# Patient Record
Sex: Male | Born: 1990 | Race: White | Hispanic: No | Marital: Single | Smoking: Light tobacco smoker
Health system: Southern US, Community
[De-identification: ages and names within clinical notes are randomized; demographics above are authoritative.]

## PROBLEM LIST (undated history)

## (undated) DIAGNOSIS — I10 Essential (primary) hypertension: Secondary | ICD-10-CM

## (undated) HISTORY — DX: Essential (primary) hypertension: I10

---

## 2016-10-19 ENCOUNTER — Encounter (HOSPITAL_COMMUNITY): Payer: Self-pay | Admitting: *Deleted

## 2016-10-19 ENCOUNTER — Emergency Department (HOSPITAL_COMMUNITY)
Admission: EM | Admit: 2016-10-19 | Discharge: 2016-10-19 | Disposition: A | Discharge status: Home / Self Care | Payer: No Typology Code available for payment source | Attending: Emergency Medicine | Admitting: Emergency Medicine

## 2016-10-19 ENCOUNTER — Emergency Department (HOSPITAL_COMMUNITY): Payer: No Typology Code available for payment source

## 2016-10-19 DIAGNOSIS — H7292 Unspecified perforation of tympanic membrane, left ear: Secondary | ICD-10-CM | POA: Diagnosis not present

## 2016-10-19 DIAGNOSIS — Y929 Unspecified place or not applicable: Secondary | ICD-10-CM | POA: Diagnosis not present

## 2016-10-19 DIAGNOSIS — F17210 Nicotine dependence, cigarettes, uncomplicated: Secondary | ICD-10-CM | POA: Diagnosis not present

## 2016-10-19 DIAGNOSIS — S0990XA Unspecified injury of head, initial encounter: Secondary | ICD-10-CM

## 2016-10-19 DIAGNOSIS — Y9389 Activity, other specified: Secondary | ICD-10-CM | POA: Insufficient documentation

## 2016-10-19 DIAGNOSIS — Y999 Unspecified external cause status: Secondary | ICD-10-CM | POA: Diagnosis not present

## 2016-10-19 MED ORDER — OFLOXACIN 0.3 % OP SOLN
5.0000 [drp] | Freq: Every day | OPHTHALMIC | Status: DC
  Administered 2016-10-19: 5 [drp] via OTIC
  Filled 2016-10-19: qty 5

## 2016-10-19 MED ORDER — ACETAMINOPHEN 325 MG PO TABS
650.0000 mg | ORAL_TABLET | Freq: Once | ORAL | Status: AC
Start: 2016-10-19 — End: 2016-10-19
  Administered 2016-10-19: 650 mg via ORAL
  Filled 2016-10-19: qty 2
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
## 2020-05-26 ENCOUNTER — Ambulatory Visit (INDEPENDENT_AMBULATORY_CARE_PROVIDER_SITE_OTHER): Payer: 59 | Admitting: Family Medicine

## 2020-05-26 ENCOUNTER — Other Ambulatory Visit: Payer: Self-pay

## 2020-05-26 ENCOUNTER — Encounter: Payer: Self-pay | Admitting: Family Medicine

## 2020-05-26 VITALS — BP 168/94 | HR 70 | Temp 98.9°F | Ht 78.0 in | Wt 354.4 lb

## 2020-05-26 DIAGNOSIS — Z202 Contact with and (suspected) exposure to infections with a predominantly sexual mode of transmission: Secondary | ICD-10-CM | POA: Diagnosis not present

## 2020-05-26 DIAGNOSIS — R400 Somnolence: Secondary | ICD-10-CM | POA: Diagnosis not present

## 2020-05-26 DIAGNOSIS — I10 Essential (primary) hypertension: Secondary | ICD-10-CM

## 2020-05-26 DIAGNOSIS — G479 Sleep disorder, unspecified: Secondary | ICD-10-CM

## 2020-05-26 DIAGNOSIS — Z114 Encounter for screening for human immunodeficiency virus [HIV]: Secondary | ICD-10-CM

## 2020-05-26 LAB — CBC
MCV: 91 fL (ref 80.0–100.0)
RBC: 5.36 10*6/uL (ref 4.20–5.80)
RDW: 12.6 % (ref 11.0–15.0)

## 2020-05-26 MED ORDER — VALSARTAN 80 MG PO TABS: 80.0000 mg | ORAL_TABLET | Freq: Every day | ORAL | 1 refills | Status: DC

## 2020-05-26 NOTE — Progress Notes (Signed)
Dakota Wolf - 30 y.o. male MRN [id number]  Date of birth: 10-Dec-1990  Subjective Chief Complaint  Patient presents with  . Hypertension    HPI Dakota Wolf is a 30 y.o. male here today for initial visit. He has lived in the area for about 4 years, decided it was time to establish with a new PCP.  He is a former Clinical cytogeneticist, now working as a Music therapist.  He has had some issues with elevated blood pressure in the past but has never been started on medication for this.  He denies symptoms related to HTN including chest pain, shortness of breath, palpitations, headache or vision changes.    He also admits to heavy snoring, daytime fatigue and frequent night time awakenings.  He has never had a sleep study before.    He would like to have STD screening today.   ROS:  A comprehensive ROS was completed and negative except as noted per HPI  No Known Allergies  History reviewed. No pertinent past medical history.  History reviewed. No pertinent surgical history.  Social History   Socioeconomic History  . Marital status: Single    Spouse name: Not on file  . Number of children: Not on file  . Years of education: Not on file  . Highest education level: Not on file  Occupational History  . Not on file  Tobacco Use  . Smoking status: Former Games developer  . Smokeless tobacco: Never Used  Vaping Use  . Vaping Use: Some days  . Substances: THC  Substance and Sexual Activity  . Alcohol use: Yes    Alcohol/week: 2.0 - 3.0 standard drinks    Types: 2 - 3 Cans of beer per week  . Drug use: No  . Sexual activity: Yes    Partners: Female    Birth control/protection: Condom  Other Topics Concern  . Not on file  Social History Narrative  . Not on file   Social Determinants of Health   Financial Resource Strain: Not on file  Food Insecurity: Not on file  Transportation Needs: Not on file  Physical Activity: Not on file  Stress: Not on file  Social Connections: Not on file     Family History  Problem Relation Age of Onset  . Hypertension Father   . Other Maternal Uncle   . Hypertension Maternal Grandmother   . Other Cousin     Health Maintenance  Topic Date Due  . Hepatitis C Screening  Never done  . COVID-19 Vaccine (1) Never done  . INFLUENZA VACCINE  Never done  . TETANUS/TDAP  11/27/2028  . HIV Screening  Completed  . HPV VACCINES  Aged Out     ----------------------------------------------------------------------------------------------------------------------------------------------------------------------------------------------------------------- Physical Exam BP (!) 168/94 (BP Location: Left Arm, Patient Position: Sitting, Cuff Size: Large)   Pulse 70   Temp 98.9 F (37.2 C) (Oral)   Ht 6\' 6"  (1.981 m)   Wt (!) 354 lb 6.4 oz (160.8 kg)   SpO2 97%   BMI 40.96 kg/m   Physical Exam Constitutional:      Appearance: Normal appearance.  HENT:     Head: Normocephalic and atraumatic.  Cardiovascular:     Rate and Rhythm: Normal rate and regular rhythm.  Pulmonary:     Effort: Pulmonary effort is normal.     Breath sounds: Normal breath sounds.  Musculoskeletal:     Cervical back: Neck supple.  Neurological:     General: No focal deficit present.  Mental Status: He is alert.  Psychiatric:        Mood and Affect: Mood normal.        Behavior: Behavior normal.     ------------------------------------------------------------------------------------------------------------------------------------------------------------------------------------------------------------------- Assessment and Plan  Essential hypertension BP elevated today and has been in the past he reports.  DASH diet and lifestyle change discussed.  Start valsartan 80mg  daily.  Follow up in about 6 weeks.    Daytime somnolence Concern for OSA, referral placed to sleep specialist.   Possible exposure to STD GC/Chlamydia, HIV and RPR ordered.    Meds  ordered this encounter  Medications  . valsartan (DIOVAN) 80 MG tablet    Sig: Take 1 tablet (80 mg total) by mouth daily.    Dispense:  90 tablet    Refill:  1    Return in about 6 weeks (around 07/07/2020) for HTN.    This visit occurred during the SARS-CoV-2 public health emergency.  Safety protocols were in place, including screening questions prior to the visit, additional usage of staff PPE, and extensive cleaning of exam room while observing appropriate contact time as indicated for disinfecting solutions.

## 2020-05-26 NOTE — Assessment & Plan Note (Signed)
Concern for OSA, referral placed to sleep specialist.

## 2020-05-26 NOTE — Assessment & Plan Note (Signed)
GC/Chlamydia, HIV and RPR ordered.

## 2020-05-26 NOTE — Patient Instructions (Signed)
Great to meet you today! Start valsartan for blood pressure Have labs completed.  You'll be contacted to set up sleep study.  See me again in about 6 weeks.    [email or web address]">  DASH Eating Plan DASH stands for Dietary Approaches to Stop Hypertension. The DASH eating plan is a healthy eating plan that has been shown to:  Reduce high blood pressure (hypertension).  Reduce your risk for type 2 diabetes, heart disease, and stroke.  Help with weight loss. What are tips for following this plan? Reading food labels  Check food labels for the amount of salt (sodium) per serving. Choose foods with less than 5 percent of the Daily Value of sodium. Generally, foods with less than 300 milligrams (mg) of sodium per serving fit into this eating plan.  To find whole grains, look for the word "whole" as the first word in the ingredient list. Shopping  Buy products labeled as "low-sodium" or "no salt added."  Buy fresh foods. Avoid canned foods and pre-made or frozen meals. Cooking  Avoid adding salt when cooking. Use salt-free seasonings or herbs instead of table salt or sea salt. Check with your health care provider or pharmacist before using salt substitutes.  Do not fry foods. Cook foods using healthy methods such as baking, boiling, grilling, roasting, and broiling instead.  Cook with heart-healthy oils, such as olive, canola, avocado, soybean, or sunflower oil. Meal planning  Eat a balanced diet that includes: ? 4 or more servings of fruits and 4 or more servings of vegetables each day. Try to fill one-half of your plate with fruits and vegetables. ? 6-8 servings of whole grains each day. ? Less than 6 oz (170 g) of lean meat, poultry, or fish each day. A 3-oz (85-g) serving of meat is about the same size as a deck of cards. One egg equals 1 oz (28 g). ? 2-3 servings of low-fat dairy each day. One serving is 1 cup (237 mL). ? 1 serving  of nuts, seeds, or beans 5 times each week. ? 2-3 servings of heart-healthy fats. Healthy fats called omega-3 fatty acids are found in foods such as walnuts, flaxseeds, fortified milks, and eggs. These fats are also found in cold-water fish, such as sardines, salmon, and mackerel.  Limit how much you eat of: ? Canned or prepackaged foods. ? Food that is high in trans fat, such as some fried foods. ? Food that is high in saturated fat, such as fatty meat. ? Desserts and other sweets, sugary drinks, and other foods with added sugar. ? Full-fat dairy products.  Do not salt foods before eating.  Do not eat more than 4 egg yolks a week.  Try to eat at least 2 vegetarian meals a week.  Eat more home-cooked food and less restaurant, buffet, and fast food.   Lifestyle  When eating at a restaurant, ask that your food be prepared with less salt or no salt, if possible.  If you drink alcohol: ? Limit how much you use to:  0-1 drink a day for women who are not pregnant.  0-2 drinks a day for men. ? Be aware of how much alcohol is in your drink. In the [place] one drink equals one 12 oz bottle of beer (355 mL), one 5 oz glass of wine (148 mL), or one 1 oz glass of hard liquor (44 mL). General information  Avoid eating more than 2,300 mg of salt a day. If you have hypertension, you may need to  reduce your sodium intake to 1,500 mg a day.  Work with your health care provider to maintain a healthy body weight or to lose weight. Ask what an ideal weight is for you.  Get at least 30 minutes of exercise that causes your heart to beat faster (aerobic exercise) most days of the week. Activities may include walking, swimming, or biking.  Work with your health care provider or dietitian to adjust your eating plan to your individual calorie needs. What foods should I eat? Fruits All fresh, dried, or frozen fruit. Canned fruit in natural juice (without added sugar). Vegetables Fresh or frozen  vegetables (raw, steamed, roasted, or grilled). Low-sodium or reduced-sodium tomato and vegetable juice. Low-sodium or reduced-sodium tomato sauce and tomato paste. Low-sodium or reduced-sodium canned vegetables. Grains Whole-grain or whole-wheat bread. Whole-grain or whole-wheat pasta. Brown rice. Orpah Cobb. Bulgur. Whole-grain and low-sodium cereals. Pita bread. Low-fat, low-sodium crackers. Whole-wheat flour tortillas. Meats and other proteins Skinless chicken or [place]. Ground chicken or [place]. Pork with fat trimmed off. Fish and seafood. Egg whites. Dried beans, peas, or lentils. Unsalted nuts, nut butters, and seeds. Unsalted canned beans. Lean cuts of beef with fat trimmed off. Low-sodium, lean precooked or cured meat, such as sausages or meat loaves. Dairy Low-fat (1%) or fat-free (skim) milk. Reduced-fat, low-fat, or fat-free cheeses. Nonfat, low-sodium ricotta or cottage cheese. Low-fat or nonfat yogurt. Low-fat, low-sodium cheese. Fats and oils Soft margarine without trans fats. Vegetable oil. Reduced-fat, low-fat, or light mayonnaise and salad dressings (reduced-sodium). Canola, safflower, olive, avocado, soybean, and sunflower oils. Avocado. Seasonings and condiments Herbs. Spices. Seasoning mixes without salt. Other foods Unsalted popcorn and pretzels. Fat-free sweets. The items listed above may not be a complete list of foods and beverages you can eat. Contact a dietitian for more information. What foods should I avoid? Fruits Canned fruit in a light or heavy syrup. Fried fruit. Fruit in cream or butter sauce. Vegetables Creamed or fried vegetables. Vegetables in a cheese sauce. Regular canned vegetables (not low-sodium or reduced-sodium). Regular canned tomato sauce and paste (not low-sodium or reduced-sodium). Regular tomato and vegetable juice (not low-sodium or reduced-sodium). Rosita Fire. Olives. Grains Baked goods made with fat, such as croissants, muffins, or some  breads. Dry pasta or rice meal packs. Meats and other proteins Fatty cuts of meat. Ribs. Fried meat. Tomasa Blase. Bologna, salami, and other precooked or cured meats, such as sausages or meat loaves. Fat from the back of a pig (fatback). Bratwurst. Salted nuts and seeds. Canned beans with added salt. Canned or smoked fish. Whole eggs or egg yolks. Chicken or [place] with skin. Dairy Whole or 2% milk, cream, and half-and-half. Whole or full-fat cream cheese. Whole-fat or sweetened yogurt. Full-fat cheese. Nondairy creamers. Whipped toppings. Processed cheese and cheese spreads. Fats and oils Butter. Stick margarine. Lard. Shortening. Ghee. Bacon fat. Tropical oils, such as coconut, palm kernel, or palm oil. Seasonings and condiments Onion salt, garlic salt, seasoned salt, table salt, and sea salt. Worcestershire sauce. Tartar sauce. Barbecue sauce. Teriyaki sauce. Soy sauce, including reduced-sodium. Steak sauce. Canned and packaged gravies. Fish sauce. Oyster sauce. Cocktail sauce. Store-bought horseradish. Ketchup. Mustard. Meat flavorings and tenderizers. Bouillon cubes. Hot sauces. Pre-made or packaged marinades. Pre-made or packaged taco seasonings. Relishes. Regular salad dressings. Other foods Salted popcorn and pretzels. The items listed above may not be a complete list of foods and beverages you should avoid. Contact a dietitian for more information. Where to find more information  National Heart, Lung, and Blood Institute: PopSteam.is  American Heart Association: www.heart.org  Academy of Nutrition and Dietetics: www.eatright.Revloc: www.kidney.org Summary  The DASH eating plan is a healthy eating plan that has been shown to reduce high blood pressure (hypertension). It may also reduce your risk for type 2 diabetes, heart disease, and stroke.  When on the DASH eating plan, aim to eat more fresh fruits and vegetables, whole grains, lean proteins, low-fat  dairy, and heart-healthy fats.  With the DASH eating plan, you should limit salt (sodium) intake to 2,300 mg a day. If you have hypertension, you may need to reduce your sodium intake to 1,500 mg a day.  Work with your health care provider or dietitian to adjust your eating plan to your individual calorie needs. This information is not intended to replace advice given to you by your health care provider. Make sure you discuss any questions you have with your health care provider. Document Revised: 02/15/2019 Document Reviewed: 02/15/2019 Elsevier Patient Education  2021 [employer].

## 2020-05-26 NOTE — Assessment & Plan Note (Signed)
BP elevated today and has been in the past he reports.  DASH diet and lifestyle change discussed.  Start valsartan 80mg  daily.  Follow up in about 6 weeks.

## 2020-05-27 LAB — COMPLETE METABOLIC PANEL WITH GFR
AG Ratio: 1.6 (calc) (ref 1.0–2.5)
ALT: 89 U/L — ABNORMAL HIGH (ref 9–46)
AST: 38 U/L (ref 10–40)
Albumin: 4.5 g/dL (ref 3.6–5.1)
Alkaline phosphatase (APISO): 68 U/L (ref 36–130)
BUN: 16 mg/dL (ref 7–25)
CO2: 29 mmol/L (ref 20–32)
Calcium: 9.6 mg/dL (ref 8.6–10.3)
Chloride: 102 mmol/L (ref 98–110)
Creat: 0.9 mg/dL (ref 0.60–1.35)
GFR, Est African American: 132 mL/min/{1.73_m2} (ref >=60)
GFR, Est Non African American: 114 mL/min/{1.73_m2} (ref >=60)
Globulin: 2.9 g/dL (calc) (ref 1.9–3.7)
Glucose, Bld: 88 mg/dL (ref 65–99)
Potassium: 4.2 mmol/L (ref 3.5–5.3)
Sodium: 140 mmol/L (ref 135–146)
Total Bilirubin: 1.1 mg/dL (ref 0.2–1.2)
Total Protein: 7.4 g/dL (ref 6.1–8.1)

## 2020-05-27 LAB — CBC
HCT: 48.8 % (ref 38.5–50.0)
Hemoglobin: 16.4 g/dL (ref 13.2–17.1)
MCH: 30.6 pg (ref 27.0–33.0)
MCHC: 33.6 g/dL (ref 32.0–36.0)
MPV: 10.7 fL (ref 7.5–12.5)
Platelets: 215 10*3/uL (ref 140–400)
WBC: 6.6 10*3/uL (ref 3.8–10.8)

## 2020-05-27 LAB — TSH: TSH: 2.56 mIU/L (ref 0.40–4.50)

## 2020-05-27 LAB — HIV ANTIBODY (ROUTINE TESTING W REFLEX): HIV 1&2 Ab, 4th Generation: NONREACTIVE

## 2020-07-02 ENCOUNTER — Ambulatory Visit: Payer: 59 | Admitting: Neurology

## 2020-07-02 ENCOUNTER — Encounter: Payer: Self-pay | Admitting: Neurology

## 2020-07-02 VITALS — BP 151/97 | HR 74 | Ht 78.0 in | Wt 356.5 lb

## 2020-07-02 DIAGNOSIS — G4719 Other hypersomnia: Secondary | ICD-10-CM

## 2020-07-02 DIAGNOSIS — R030 Elevated blood-pressure reading, without diagnosis of hypertension: Secondary | ICD-10-CM | POA: Diagnosis not present

## 2020-07-02 DIAGNOSIS — R0683 Snoring: Secondary | ICD-10-CM | POA: Diagnosis not present

## 2020-07-02 DIAGNOSIS — J342 Deviated nasal septum: Secondary | ICD-10-CM

## 2020-07-02 DIAGNOSIS — R635 Abnormal weight gain: Secondary | ICD-10-CM

## 2020-07-02 DIAGNOSIS — Z9189 Other specified personal risk factors, not elsewhere classified: Secondary | ICD-10-CM

## 2020-07-02 DIAGNOSIS — R0689 Other abnormalities of breathing: Secondary | ICD-10-CM

## 2020-07-02 DIAGNOSIS — R0681 Apnea, not elsewhere classified: Secondary | ICD-10-CM

## 2020-07-02 DIAGNOSIS — J351 Hypertrophy of tonsils: Secondary | ICD-10-CM

## 2020-07-02 NOTE — Progress Notes (Signed)
Subjective:    Patient ID: Dakota Wolf is a 30 y.o. male.  HPI     Huston Foley, MD, PhD [facility] [address] [address] [place], [place] [address]  Dear Dr. Ashley Royalty,   I saw your patient, Dakota Wolf, upon your kind request, in my Sleep clinic today for initial consultation of his sleep disorder, in particular, concern for underlying obstructive sleep apnea.  The patient is unaccompanied today.  As you know, [name] is a 30 year old right-handed gentleman with an underlying medical history of hypertension, and severe obesity with a BMI of over 40, who reports snoring and excessive daytime somnolence.  I reviewed your office note from 05/26/2020.  His Epworth sleepiness score is 8/24, fatigue severity score is 40 out of 63.  He does not wake up rested.  He does not remember the last time he slept through the night.  He has been noted to have pauses in his breathing and has woken up with a sense of gasping for air.  He is not aware of any family history of sleep apnea.  He saw an ENT in [facility] for an ear infection was noted to have large tonsils and suspected to have sleep apnea.  He did not take his blood pressure medicine today but does take it consistently.  He has lower extremity edema occasionally.  He reports a bedtime of 10 and rise time between 730 and 8.  He lives with his mother currently.  He is single, no children, has 2 dogs and 1 cat in the household.  He is a noncigarette smoker but smokes a cigar occasionally.  He drinks caffeine in the form of coffee, about 20 ounce cup per day and 1 or 2 sodas per day, is trying to cut back.  He has gained weight in the past year and a half in the realm of 30 pounds.  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  He played football and has had nasal injuries, was told he had a deviated septum as well.  It was never corrected.  He denies night to night  nocturia or recurrent morning headaches.  He will be traveling to [facility] for a try out for a movie next week and may stay for 1 month if he gets the role.  He has never had a sleep study, would prefer to come in for a laboratory attended sleep study if possible.  He drinks alcohol occasionally, up to 3 drinks a week.  His Past Medical History Is Significant For: Past Medical History:  Diagnosis Date  . Hypertension     His Past Surgical History Is Significant For: No past surgical history on file.  His Family History Is Significant For: Family History  Problem Relation Age of Onset  . Hypertension Father   . Other Maternal Uncle   . Hypertension Maternal Grandmother   . Other Cousin     His Social History Is Significant For: Social History   Socioeconomic History  . Marital status: Single    Spouse name: Not on file  . Number of children: 0  . Years of education: Not on file  . Highest education level: Bachelor's degree (e.g., BA, AB, BS)  Occupational History  . Not on file  Tobacco Use  . Smoking status: Light Tobacco Smoker    Types: Cigars  . Smokeless tobacco: Never Used  . Tobacco comment: 07/02/20 occas cigar  Vaping Use  . Vaping Use: Some days  . Substances: THC  Substance and Sexual Activity  . Alcohol use: Yes    Alcohol/week: 2.0 - 3.0 standard drinks    Types: 2 - 3 Cans of beer per week  . Drug use: No  . Sexual activity: Yes    Partners: Female    Birth control/protection: Condom  Other Topics Concern  . Not on file  Social History Narrative   Caffeine- 20 oz coffee, 2 sodas daily   Social Determinants of Health   Financial Resource Strain: Not on file  Food Insecurity: Not on file  Transportation Needs: Not on file  Physical Activity: Not on file  Stress: Not on file  Social Connections: Not on file    His Allergies Are:  No Known Allergies:   His Current Medications Are:  Outpatient Encounter Medications as of 07/02/2020  Medication Sig   . valsartan (DIOVAN) 80 MG tablet Take 1 tablet (80 mg total) by mouth daily.   No facility-administered encounter medications on file as of 07/02/2020.  :   Review of Systems:  Out of a complete 14 point review of systems, all are reviewed and negative with the exception of these symptoms as listed below:  Review of Systems  Neurological:       New patient, paper referral here alone for daytime sleepiness, sleep disturbance, wake frequently during the night. No prior sleep study.  Epworth Sleepiness Scale 0= would never doze 1= slight chance of dozing 2= moderate chance of dozing 3= high chance of dozing  Sitting and reading:1 Watching TV:2 Sitting inactive in a public place (ex. Theater or meeting):1 As a passenger in a car for an hour without a break:2 Lying down to rest in the afternoon:2 Sitting and talking to someone:0 Sitting quietly after lunch (no alcohol):0 In a car, while stopped in traffic:0 Total:8    Objective:  Neurological Exam  Physical Exam Physical Examination:   Vitals:   07/02/20 0853  BP: (!) 151/97  Pulse: 74    General Examination: The patient is a very pleasant 30 y.o. male in no acute distress. He appears well-developed and well-nourished and well groomed.   HEENT: Normocephalic, atraumatic, pupils are equal, round and reactive to light, extraocular tracking is good without limitation to gaze excursion or nystagmus noted. Hearing is grossly intact. Face is symmetric with normal facial animation. Speech is clear with no dysarthria noted. There is no hypophonia. There is no lip, neck/head, jaw or voice tremor. Neck is supple with full range of passive and active motion. There are no carotid bruits on auscultation. Oropharynx exam reveals: mild mouth dryness, adequate dental hygiene and marked airway crowding, due to severe tonsillar hypertrophy, right side 4+, left side 3+.  Mallampati class II.  Neck circumference of 19-1/2 inches.  Tongue  protrudes centrally and palate elevates symmetrically.  Nasal inspection reveals mildly deviated septum, also mild nasal deformity from the outside.  Chest: Clear to auscultation without wheezing, rhonchi or crackles noted.  Heart: S1+S2+0, regular and normal without murmurs, rubs or gallops noted.   Abdomen: Soft, non-tender and non-distended with normal bowel sounds appreciated on auscultation.  Extremities: There is no pitting edema in the distal lower extremities bilaterally.   Skin: Warm and dry without trophic changes noted.   Musculoskeletal: exam reveals no obvious joint deformities, tenderness or joint swelling or erythema.   Neurologically:  Mental status: The patient is awake, alert and oriented in all 4 spheres. His immediate  and remote memory, attention, language skills and fund of knowledge are appropriate. There is no evidence of aphasia, agnosia, apraxia or anomia. Speech is clear with normal prosody and enunciation. Thought process is linear. Mood is normal and affect is normal.  Cranial nerves II - XII are as described above under HEENT exam.  Motor exam: Normal bulk, strength and tone is noted. There is no tremor. Fine motor skills and coordination: grossly intact.  Cerebellar testing: No dysmetria or intention tremor. There is no truncal or gait ataxia.  Sensory exam: intact to light touch in the upper and lower extremities.  Gait, station and balance: He stands easily. No veering to one side is noted. No leaning to one side is noted. Posture is age-appropriate and stance is narrow based. Gait shows normal stride length and normal pace. No problems turning are noted.   Assessment and Plan:  In summary, Dakota Mandigo is a very pleasant 30 y.o.-year old male with an underlying medical history of hypertension, and severe obesity with a BMI of over 40, whose history and physical exam are concerning for obstructive sleep apnea (OSA). I had a long chat with the patient about my  findings and the diagnosis of OSA, its prognosis and treatment options. We talked about medical treatments, surgical interventions and non-pharmacological approaches. I explained in particular the risks and ramifications of untreated moderate to severe OSA, especially with respect to developing cardiovascular disease down the Road, including congestive heart failure, difficult to treat hypertension, cardiac arrhythmias, or stroke. Even type 2 diabetes has, in part, been linked to untreated OSA. Symptoms of untreated OSA include daytime sleepiness, memory problems, mood irritability and mood disorder such as depression and anxiety, lack of energy, as well as recurrent headaches, especially morning headaches. We talked about smoking cessation and trying to maintain a healthy lifestyle in general, as well as the importance of weight control. We also talked about the importance of good sleep hygiene. I recommended the following at this time: sleep study.   I explained the sleep test procedure to the patient and also outlined possible surgical and non-surgical treatment options of OSA, including the use of a custom-made dental device (which would require a referral to a specialist dentist or oral surgeon), upper airway surgical options, such as traditional UPPP or a novel less invasive surgical option in the form of Inspire hypoglossal nerve stimulation (which would involve a referral to an ENT surgeon). I also explained the CPAP treatment option to the patient, who indicated that he would be willing to try CPAP if the need arises. I explained the importance of being compliant with PAP treatment, not only for insurance purposes but primarily to improve His symptoms, and for the patient's long term health benefit, including to reduce His cardiovascular risks. I answered all his questions today and the patient was in agreement. I plan to see him back after the sleep study is completed and encouraged him to call with  any interim questions, concerns, problems or updates.   Thank you very much for allowing me to participate in the care of this nice patient. If I can be of any further assistance to you please do not hesitate to call me at [phone].  Sincerely,   Huston Foley, MD, PhD

## 2020-07-02 NOTE — Patient Instructions (Signed)

## 2020-07-07 ENCOUNTER — Other Ambulatory Visit: Payer: Self-pay

## 2020-07-07 ENCOUNTER — Encounter: Payer: Self-pay | Admitting: Family Medicine

## 2020-07-07 ENCOUNTER — Ambulatory Visit (INDEPENDENT_AMBULATORY_CARE_PROVIDER_SITE_OTHER): Payer: 59 | Admitting: Family Medicine

## 2020-07-07 VITALS — BP 160/95 | HR 72 | Temp 97.7°F | Wt 363.0 lb

## 2020-07-07 DIAGNOSIS — I10 Essential (primary) hypertension: Secondary | ICD-10-CM

## 2020-07-07 DIAGNOSIS — R400 Somnolence: Secondary | ICD-10-CM

## 2020-07-07 MED ORDER — VALSARTAN 160 MG PO TABS: 160.0000 mg | ORAL_TABLET | Freq: Every day | ORAL | 1 refills | Status: DC

## 2020-07-07 NOTE — Assessment & Plan Note (Signed)
Has upcoming sleep study. 

## 2020-07-07 NOTE — Assessment & Plan Note (Signed)
Blood pressure is not at goal at for age and co-morbidities.  I recommend increasing valsartan to 160mg .  In addition they were instructed to follow a low sodium diet with regular exercise to help to maintain adequate control of blood pressure.

## 2020-07-07 NOTE — Patient Instructions (Signed)
Increase valsartan to 160mg  daily.  See me again in about 6 weeks for follow up.  Have labs completed today.

## 2020-07-07 NOTE — Progress Notes (Signed)
Dakota Wolf - 30 y.o. male MRN [id number]  Date of birth: 21-Jun-1990  Subjective Chief Complaint  Patient presents with  . Hypertension    HPI Dakota Wolf is a 30 y.o. male here today for follow up of HTN.  Started on valsartan 80mg  at last visit.  He is tolerating this well.  BP remains elevated today.  He has tried to be more conscious about salt intake.  Weight his up about 10lbs since his last visit.  He did have evaluation for sleep study and plans to have this completed depending on insurance coverage.  He denies chest pain, shortness of breath, palpitations, headache or vision changes.   ROS:  A comprehensive ROS was completed and negative except as noted per HPI  No Known Allergies  Past Medical History:  Diagnosis Date  . Hypertension     History reviewed. No pertinent surgical history.  Social History   Socioeconomic History  . Marital status: Single    Spouse name: Not on file  . Number of children: 0  . Years of education: Not on file  . Highest education level: Bachelor's degree (e.g., BA, AB, BS)  Occupational History  . Not on file  Tobacco Use  . Smoking status: Light Tobacco Smoker    Types: Cigars  . Smokeless tobacco: Never Used  . Tobacco comment: 07/02/20 occas cigar  Vaping Use  . Vaping Use: Some days  . Substances: THC  Substance and Sexual Activity  . Alcohol use: Yes    Alcohol/week: 2.0 - 3.0 standard drinks    Types: 2 - 3 Cans of beer per week  . Drug use: No  . Sexual activity: Yes    Partners: Female    Birth control/protection: Condom  Other Topics Concern  . Not on file  Social History Narrative   Caffeine- 20 oz coffee, 2 sodas daily   Social Determinants of Health   Financial Resource Strain: Not on file  Food Insecurity: Not on file  Transportation Needs: Not on file  Physical Activity: Not on file  Stress: Not on file  Social Connections: Not on file    Family History  Problem Relation Age of Onset  . Hypertension  Father   . Other Maternal Uncle   . Hypertension Maternal Grandmother   . Other Cousin     Health Maintenance  Topic Date Due  . Hepatitis C Screening  Never done  . COVID-19 Vaccine (1) Never done  . INFLUENZA VACCINE  10/26/2020  . TETANUS/TDAP  11/27/2028  . HIV Screening  Completed  . HPV VACCINES  Aged Out     ----------------------------------------------------------------------------------------------------------------------------------------------------------------------------------------------------------------- Physical Exam BP (!) 160/95 (BP Location: Left Arm, Patient Position: Sitting, Cuff Size: Large)   Pulse 72   Temp 97.7 F (36.5 C)   Wt (!) 363 lb (164.7 kg)   SpO2 100%   BMI 41.95 kg/m   Physical Exam Constitutional:      Appearance: Normal appearance.  HENT:     Head: Normocephalic and atraumatic.  Eyes:     General: No scleral icterus. Cardiovascular:     Rate and Rhythm: Normal rate and regular rhythm.  Pulmonary:     Effort: Pulmonary effort is normal.     Breath sounds: Normal breath sounds.  Musculoskeletal:     Cervical back: Neck supple.  Neurological:     General: No focal deficit present.     Mental Status: He is alert.  Psychiatric:        Mood  and Affect: Mood normal.        Behavior: Behavior normal.     ------------------------------------------------------------------------------------------------------------------------------------------------------------------------------------------------------------------- Assessment and Plan  Essential hypertension Blood pressure is not at goal at for age and co-morbidities.  I recommend increasing valsartan to 160mg .  In addition they were instructed to follow a low sodium diet with regular exercise to help to maintain adequate control of blood pressure.    Daytime somnolence Has upcoming sleep study.     Meds ordered this encounter  Medications  . valsartan (DIOVAN) 160 MG  tablet    Sig: Take 1 tablet (160 mg total) by mouth daily.    Dispense:  90 tablet    Refill:  1    Return in about 6 weeks (around 08/18/2020) for HTN.    This visit occurred during the SARS-CoV-2 public health emergency.  Safety protocols were in place, including screening questions prior to the visit, additional usage of staff PPE, and extensive cleaning of exam room while observing appropriate contact time as indicated for disinfecting solutions.

## 2020-07-08 LAB — BASIC METABOLIC PANEL
BUN: 16 mg/dL (ref 7–25)
CO2: 25 mmol/L (ref 20–32)
Calcium: 9.4 mg/dL (ref 8.6–10.3)
Chloride: 104 mmol/L (ref 98–110)
Creat: 0.98 mg/dL (ref 0.60–1.35)
Glucose, Bld: 90 mg/dL (ref 65–99)
Potassium: 4.7 mmol/L (ref 3.5–5.3)
Sodium: 141 mmol/L (ref 135–146)

## 2020-08-03 ENCOUNTER — Telehealth: Payer: Self-pay

## 2020-08-03 NOTE — Telephone Encounter (Signed)
LVM for pt to call me back to schedule sleep study  

## 2020-08-17 ENCOUNTER — Telehealth: Payer: Self-pay

## 2020-08-17 NOTE — Telephone Encounter (Signed)
LVM for pt to call me back to schedule sleep study  

## 2020-08-18 ENCOUNTER — Ambulatory Visit: Payer: 59 | Admitting: Family Medicine

## 2020-08-25 ENCOUNTER — Ambulatory Visit (INDEPENDENT_AMBULATORY_CARE_PROVIDER_SITE_OTHER): Payer: 59 | Admitting: Family Medicine

## 2020-08-25 ENCOUNTER — Encounter: Payer: Self-pay | Admitting: Family Medicine

## 2020-08-25 ENCOUNTER — Other Ambulatory Visit: Payer: Self-pay

## 2020-08-25 ENCOUNTER — Telehealth: Payer: Self-pay

## 2020-08-25 DIAGNOSIS — I10 Essential (primary) hypertension: Secondary | ICD-10-CM | POA: Diagnosis not present

## 2020-08-25 MED ORDER — VALSARTAN 160 MG PO TABS: 160.0000 mg | ORAL_TABLET | Freq: Every day | ORAL | 2 refills | Status: DC

## 2020-08-25 NOTE — Progress Notes (Signed)
Dakota Wolf - 30 y.o. male MRN [id number]  Date of birth: 1990/07/09  Subjective No chief complaint on file.   HPI Dakota Wolf is a 30 y.o. male here today for follow up of HTN.  Valsartan increased to 160mg  at last appointment.  He is tolerating this well with better control of BP at today's visit.  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  He has been contacted for sleep study appt but hasn't returned call to set up appointment.  He denies chest pain, shortness of breath, palpitations, headache or vision changes.   ROS:  A comprehensive ROS was completed and negative except as noted per HPI  No Known Allergies  Past Medical History:  Diagnosis Date  . Hypertension     No past surgical history on file.  Social History   Socioeconomic History  . Marital status: Single    Spouse name: Not on file  . Number of children: 0  . Years of education: Not on file  . Highest education level: Bachelor's degree (e.g., BA, AB, BS)  Occupational History  . Not on file  Tobacco Use  . Smoking status: Light Tobacco Smoker    Types: Cigars  . Smokeless tobacco: Never Used  . Tobacco comment: 07/02/20 occas cigar  Vaping Use  . Vaping Use: Some days  . Substances: THC  Substance and Sexual Activity  . Alcohol use: Yes    Alcohol/week: 2.0 - 3.0 standard drinks    Types: 2 - 3 Cans of beer per week  . Drug use: No  . Sexual activity: Yes    Partners: Female    Birth control/protection: Condom  Other Topics Concern  . Not on file  Social History Narrative   Caffeine- 20 oz coffee, 2 sodas daily   Social Determinants of Health   Financial Resource Strain: Not on file  Food Insecurity: Not on file  Transportation Needs: Not on file  Physical Activity: Not on file  Stress: Not on file  Social Connections: Not on file    Family History  Problem Relation Age of Onset  . Hypertension Father   . Other Maternal Uncle    . Hypertension Maternal Grandmother   . Other Cousin     Health Maintenance  Topic Date Due  . COVID-19 Vaccine (1) Never done  . Hepatitis C Screening  Never done  . INFLUENZA VACCINE  10/26/2020  . TETANUS/TDAP  11/27/2028  . Zoster Vaccines- Shingrix (1 of 2) 12/07/2039  . HIV Screening  Completed  . HPV VACCINES  Aged Out     ----------------------------------------------------------------------------------------------------------------------------------------------------------------------------------------------------------------- Physical Exam BP 138/88   Pulse 87   Ht 6\' 6"  (1.981 m)   Wt (!) 363 lb (164.7 kg)   BMI 41.95 kg/m   Physical Exam HENT:     Head: Normocephalic and atraumatic.  Cardiovascular:     Rate and Rhythm: Normal rate and regular rhythm.  Pulmonary:     Effort: Pulmonary effort is normal.     Breath sounds: Normal breath sounds.  Neurological:     General: No focal deficit present.     Mental Status: He is alert.  Psychiatric:        Mood and Affect: Mood normal.        Behavior: Behavior normal.     ------------------------------------------------------------------------------------------------------------------------------------------------------------------------------------------------------------------- Assessment and Plan  Essential hypertension Blood pressure is at goal at for age and co-morbidities.  I recommend continuation  of valsartan at current strength.  He will call to schedule sleep study.  In addition they were instructed to follow a low sodium diet with regular exercise to help to maintain adequate control of blood pressure.     No orders of the defined types were placed in this encounter.   No follow-ups on file.    This visit occurred during the SARS-CoV-2 public health emergency.  Safety protocols were in place, including screening questions prior to the visit, additional usage of staff PPE, and extensive  cleaning of exam room while observing appropriate contact time as indicated for disinfecting solutions.

## 2020-08-25 NOTE — Telephone Encounter (Signed)
We have attempted to call the patient 2 times to schedule sleep study. Patient has been unavailable at the phone numbers we have on file and has not returned our calls. If patient calls back we will schedule them for their sleep study. ° °

## 2020-08-25 NOTE — Assessment & Plan Note (Signed)
Blood pressure is at goal at for age and co-morbidities.  I recommend continuation of valsartan at current strength.  He will call to schedule sleep study.  In addition they were instructed to follow a low sodium diet with regular exercise to help to maintain adequate control of blood pressure.

## 2020-10-21 ENCOUNTER — Encounter: Payer: Self-pay | Admitting: Neurology

## 2021-03-03 ENCOUNTER — Ambulatory Visit (INDEPENDENT_AMBULATORY_CARE_PROVIDER_SITE_OTHER): Payer: 59 | Admitting: Family Medicine

## 2021-03-03 ENCOUNTER — Other Ambulatory Visit: Payer: Self-pay

## 2021-03-03 ENCOUNTER — Encounter: Payer: Self-pay | Admitting: Family Medicine

## 2021-03-03 DIAGNOSIS — I10 Essential (primary) hypertension: Secondary | ICD-10-CM | POA: Diagnosis not present

## 2021-03-03 DIAGNOSIS — R400 Somnolence: Secondary | ICD-10-CM | POA: Diagnosis not present

## 2021-03-03 MED ORDER — VALSARTAN 160 MG PO TABS: 160.0000 mg | ORAL_TABLET | Freq: Every day | ORAL | 2 refills | Status: DC

## 2021-03-03 NOTE — Assessment & Plan Note (Signed)
Blood pressure remains mildly elevated.  He will continue to work on diet and exercise changes.  Continue Diovan at current strength.  Low-sodium diet encouraged.  Follow-up in 6 months

## 2021-03-03 NOTE — Assessment & Plan Note (Signed)
He had to reschedule his sleep study.  He will contact neurology to get this set back up again.

## 2021-03-03 NOTE — Progress Notes (Signed)
Dakota Wolf - 30 y.o. male MRN [id number]  Date of birth: 12-22-1990  Subjective Chief Complaint  Patient presents with   Hypertension    HPI Dakota is a very pleasant 30 year old male here today for follow-up visit.  Reports overall he is doing well.  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  Continues to tolerate valsartan at current strength.  Blood pressure readings have improved.  He is working on diet and exercise changes well to improve his blood pressure.  He denies any symptoms related to hypertension including chest pain, shortness of breath, palpitations, headache or vision changes.  ROS:  A comprehensive ROS was completed and negative except as noted per HPI  No Known Allergies  Past Medical History:  Diagnosis Date   Hypertension     History reviewed. No pertinent surgical history.  Social History   Socioeconomic History   Marital status: Single    Spouse name: Not on file   Number of children: 0   Years of education: Not on file   Highest education level: Bachelor's degree (e.g., BA, AB, BS)  Occupational History   Not on file  Tobacco Use   Smoking status: Light Smoker    Types: Cigars   Smokeless tobacco: Never   Tobacco comments:    07/02/20 occas cigar  Vaping Use   Vaping Use: Some days   Substances: THC  Substance and Sexual Activity   Alcohol use: Yes    Alcohol/week: 2.0 - 3.0 standard drinks    Types: 2 - 3 Cans of beer per week   Drug use: No   Sexual activity: Yes    Partners: Female    Birth control/protection: Condom  Other Topics Concern   Not on file  Social History Narrative   Caffeine- 20 oz coffee, 2 sodas daily   Social Determinants of Health   Financial Resource Strain: Not on file  Food Insecurity: Not on file  Transportation Needs: Not on file  Physical Activity: Not on file  Stress: Not on file  Social Connections: Not on file    Family History  Problem Relation Age of Onset    Hypertension Father    Other Maternal Uncle    Hypertension Maternal Grandmother    Other Cousin     Health Maintenance  Topic Date Due   Hepatitis C Screening  Never done   INFLUENZA VACCINE  06/25/2021 (Originally 10/26/2020)   Pneumococcal Vaccine 6-15 Years old (1 - PCV) 03/03/2022 (Originally 12/07/1995)   COVID-19 Vaccine (1) 03/19/2022 (Originally 06[birth year]06/1990)   TETANUS/TDAP  11/27/2028   HIV Screening  Completed   HPV VACCINES  Aged Out     ----------------------------------------------------------------------------------------------------------------------------------------------------------------------------------------------------------------- Physical Exam BP (!) 149/76 (BP Location: Left Arm, Patient Position: Sitting, Cuff Size: Large)   Pulse 68   Ht 6\' 6"  (1.981 m)   Wt (!) 359 lb (162.8 kg)   SpO2 97%   BMI 41.49 kg/m   Physical Exam Constitutional:      Appearance: Normal appearance.  Eyes:     General: No scleral icterus. Cardiovascular:     Rate and Rhythm: Normal rate and regular rhythm.  Pulmonary:     Effort: Pulmonary effort is normal.     Breath sounds: Normal breath sounds.  Musculoskeletal:     Cervical back: Neck supple.  Neurological:     Mental Status: He is alert.  Psychiatric:        Mood and Affect: Mood normal.  Behavior: Behavior normal.    ------------------------------------------------------------------------------------------------------------------------------------------------------------------------------------------------------------------- Assessment and Plan  Essential hypertension Blood pressure remains mildly elevated.  He will continue to work on diet and exercise changes.  Continue Diovan at current strength.  Low-sodium diet encouraged.  Follow-up in 6 months  Daytime somnolence He had to reschedule his sleep study.  He will contact neurology to get this set back up again.   Meds ordered this encounter   Medications   valsartan (DIOVAN) 160 MG tablet    Sig: Take 1 tablet (160 mg total) by mouth daily.    Dispense:  90 tablet    Refill:  2    Return in about 6 months (around 09/01/2021) for HTN.    This visit occurred during the SARS-CoV-2 public health emergency.  Safety protocols were in place, including screening questions prior to the visit, additional usage of staff PPE, and extensive cleaning of exam room while observing appropriate contact time as indicated for disinfecting solutions.

## 2021-03-03 NOTE — Patient Instructions (Signed)
[facility](Dr. [id number])-([phone]) [phone]

## 2021-05-27 ENCOUNTER — Ambulatory Visit (INDEPENDENT_AMBULATORY_CARE_PROVIDER_SITE_OTHER): Payer: 59

## 2021-05-27 ENCOUNTER — Other Ambulatory Visit: Payer: Self-pay

## 2021-05-27 ENCOUNTER — Ambulatory Visit (INDEPENDENT_AMBULATORY_CARE_PROVIDER_SITE_OTHER): Payer: 59 | Admitting: Family Medicine

## 2021-05-27 ENCOUNTER — Encounter: Payer: Self-pay | Admitting: Family Medicine

## 2021-05-27 VITALS — BP 147/91 | HR 86 | Ht 78.0 in | Wt 358.0 lb

## 2021-05-27 DIAGNOSIS — M5416 Radiculopathy, lumbar region: Secondary | ICD-10-CM | POA: Diagnosis not present

## 2021-05-27 MED ORDER — KETOROLAC TROMETHAMINE 30 MG/ML IJ SOLN
30.0000 mg | Freq: Once | INTRAMUSCULAR | Status: AC
  Administered 2021-05-27: 30 mg via INTRAMUSCULAR

## 2021-05-27 MED ORDER — CYCLOBENZAPRINE HCL 10 MG PO TABS: 10.0000 mg | ORAL_TABLET | Freq: Three times a day (TID) | ORAL | 0 refills | Status: DC | PRN

## 2021-05-27 MED ORDER — PREDNISONE 50 MG PO TABS: ORAL_TABLET | ORAL | 0 refills | Status: DC

## 2021-05-27 MED ORDER — KETOROLAC TROMETHAMINE 30 MG/ML IJ SOLN: 30.0000 mg | Freq: Once | INTRAMUSCULAR | Status: DC

## 2021-05-27 MED ORDER — TRAMADOL HCL 50 MG PO TABS: 50.0000 mg | ORAL_TABLET | Freq: Three times a day (TID) | ORAL | 0 refills | Status: AC | PRN

## 2021-05-27 NOTE — Assessment & Plan Note (Signed)
Injection of Toradol 30 mg given today.  Start prednisone 50 mg daily with Flexeril as needed.  We will also add tramadol as needed.  Discussed stretches to help with symptoms.  Updated x-rays ordered.  If symptoms not improving we will plan to get MRI for interventional planning. ?

## 2021-05-27 NOTE — Progress Notes (Signed)
?Dakota Wolf - 31 y.o. male MRN [id number]  Date of birth: Jan 07, 1991 ? ?Subjective ?Chief Complaint  ?Patient presents with  ? Back Pain  ? ? ?HPI ?Dakota is a 31 year old male here today with complaint of back pain.  He has history of herniated disc and has flares from time to time.  He has had ESI in the past.  Reports that current symptoms started yesterday after sneezing.  He does have some radiation into the left leg.  He denies any weakness.  He has tried ibuprofen without much relief.  He is interested in having MRI with potential interventional procedures if not improving. ? ?ROS:  A comprehensive ROS was completed and negative except as noted per HPI ? ?No Known Allergies ? ?Past Medical History:  ?Diagnosis Date  ? Hypertension   ? ? ?History reviewed. No pertinent surgical history. ? ?Social History  ? ?Socioeconomic History  ? Marital status: Single  ?  Spouse name: Not on file  ? Number of children: 0  ? Years of education: Not on file  ? Highest education level: Bachelor's degree (e.g., BA, AB, BS)  ?Occupational History  ? Not on file  ?Tobacco Use  ? Smoking status: Light Smoker  ?  Types: Cigars  ? Smokeless tobacco: Never  ? Tobacco comments:  ?  07/02/20 occas cigar  ?Vaping Use  ? Vaping Use: Some days  ? Substances: THC  ?Substance and Sexual Activity  ? Alcohol use: Yes  ?  Alcohol/week: 2.0 - 3.0 standard drinks  ?  Types: 2 - 3 Cans of beer per week  ? Drug use: No  ? Sexual activity: Yes  ?  Partners: Female  ?  Birth control/protection: Condom  ?Other Topics Concern  ? Not on file  ?Social History Narrative  ? Caffeine- 20 oz coffee, 2 sodas daily  ? ?Social Determinants of Health  ? ?Financial Resource Strain: Not on file  ?Food Insecurity: Not on file  ?Transportation Needs: Not on file  ?Physical Activity: Not on file  ?Stress: Not on file  ?Social Connections: Not on file  ? ? ?Family History  ?Problem Relation Age of Onset  ? Hypertension Father   ? Other Maternal Uncle   ? Hypertension  Maternal Grandmother   ? Other Cousin   ? ? ?Health Maintenance  ?Topic Date Due  ? Hepatitis C Screening  Never done  ? INFLUENZA VACCINE  06/25/2021 (Originally 10/26/2020)  ? COVID-19 Vaccine (1) 03/19/2022 (Originally 06/06/1990)  ? TETANUS/TDAP  11/27/2028  ? HIV Screening  Completed  ? HPV VACCINES  Aged Out  ? ? ? ?----------------------------------------------------------------------------------------------------------------------------------------------------------------------------------------------------------------- ?Physical Exam ?BP (!) 147/91 (BP Location: Left Arm, Patient Position: Sitting, Cuff Size: Large)   Pulse 86   Ht 6\' 6"  (1.981 m)   Wt (!) 358 lb (162.4 kg)   SpO2 97%   BMI 41.37 kg/m?  ? ?Physical Exam ?Constitutional:   ?   Appearance: Normal appearance.  ?Musculoskeletal:  ?   Comments: Range of motion is limited in all planes.  He does have some tightness of lumbar paraspinals especially on the left.  Positive straight leg raise on the left. ? ?Strength in lower extremities is normal.  ?Neurological:  ?   General: No focal deficit present.  ?   Mental Status: He is alert.  ?Psychiatric:     ?   Mood and Affect: Mood normal.     ?   Behavior: Behavior normal.  ? ? ?------------------------------------------------------------------------------------------------------------------------------------------------------------------------------------------------------------------- ?Assessment and Plan ? ?  Lumbar radiculitis ?Injection of Toradol 30 mg given today.  Start prednisone 50 mg daily with Flexeril as needed.  We will also add tramadol as needed.  Discussed stretches to help with symptoms.  Updated x-rays ordered.  If symptoms not improving we will plan to get MRI for interventional planning. ? ? ?Meds ordered this encounter  ?Medications  ? predniSONE (DELTASONE) 50 MG tablet  ?  Sig: Take 1 tab po daily x5 days.  ?  Dispense:  5 tablet  ?  Refill:  0  ? cyclobenzaprine (FLEXERIL) 10  MG tablet  ?  Sig: Take 1 tablet (10 mg total) by mouth 3 (three) times daily as needed for muscle spasms.  ?  Dispense:  30 tablet  ?  Refill:  0  ? traMADol (ULTRAM) 50 MG tablet  ?  Sig: Take 1 tablet (50 mg total) by mouth every 8 (eight) hours as needed for up to 5 days.  ?  Dispense:  15 tablet  ?  Refill:  0  ? DISCONTD: ketorolac (TORADOL) 30 MG/ML injection 30 mg  ? ketorolac (TORADOL) 30 MG/ML injection 30 mg  ? ? ?No follow-ups on file. ? ? ? ?This visit occurred during the SARS-CoV-2 public health emergency.  Safety protocols were in place, including screening questions prior to the visit, additional usage of staff PPE, and extensive cleaning of exam room while observing appropriate contact time as indicated for disinfecting solutions.  ? ?

## 2021-05-27 NOTE — Patient Instructions (Signed)
Herniated Disk A herniated disk happens when a disk in the spine bulges out too far. There is an oval disk between each pair of bones (vertebrae) in the backbone or spine. The disks connect the bones, help the spine move, and keep the bones from rubbing against each other when you move. A herniated disk can happen anywhere in the back or neck area. It most often affects the lower back. What are the causes? This condition may be caused by: Wear and tear as you age. Sudden injury, such as a strain or sprain. What increases the risk? The following factors may make you more likely to develop this condition: Age. The older you are the higher the risk. Being a man who is 30-50 years old. Doing activities that involve heavy lifting, bending, or twisting. Not getting enough exercise. Being overweight. Smoking or using tobacco. What are the signs or symptoms? Symptoms may vary depending on where the herniated disk is in your body. Sharp pain in the arm, hip, butt, or in the lower back. The pain can spread to the leg and foot. Dizziness. A feeling that things are moving when they are not (vertigo). Pain or weakness in the neck, shoulder, upper or lower arm, or fingers. Muscle weakness. You may not be able to: Lift your arm or leg. Stand on your toes. Squeeze with your hands. Loss of feeling (numbness) or tingling in the hands, arms, feet, or legs. Being unable to control when to poop or pee. This is rare but serious. How is this treated? This condition may be treated with: Resting for a few days or several weeks. Do not do things that need a lot of effort. Do not go into complete bed rest. Do only light activities. If you have a herniated disk in your lower back, limit how much you sit. Sitting puts more pressure on the disk. Medicines for pain, swelling, or tense muscles. Ice or heat therapy. Steroid shots. These can reduce pain and swelling. Physical therapy to strengthen your  back. Follow these instructions at home: Medicines Take over-the-counter and prescription medicines only as told by your doctor. If told, take steps to prevent problems with pooping (constipation). You may need to: Drink enough fluid to keep your pee (urine) pale yellow. Take over-the-counter or prescription medicines. Eat foods that are high in fiber. These include beans, whole grains, and fresh fruits and vegetables. Limit foods that are high in fat and processed sugars. These include fried or sweet foods. Ask your doctor if you should avoid driving or using machines while you are taking your medicines. Managing pain, stiffness, and swelling   If told, put ice on the affected area. To do this: Put ice in a plastic bag. Place a towel between your skin and the bag. Leave the ice on for 20 minutes, 2-3 times a day. If told, put heat on the painful area. Use the heat source that your doctor recommends, such as a moist heat pack or a heating pad. Place a towel between your skin and the heat source. Leave the heat on for 20-30 minutes. Take off the heat or ice if your skin turns bright red. If you cannot feel pain, heat, or cold, you have a greater risk of getting burned. Activity Rest as told by your doctor. Avoid strict bed rest. Do only activities that do not cause pain. After your rest period: Return to your normal activities as told by your doctor. Slowly start doing exercises as told. Ask your   doctor what activities and exercises are safe for you. Use good posture. Avoid movements that cause pain. Do not lift anything that is heavier than 10 lb (4.5 kg), or the limit that you are told. Do not sit or stand for a long time without moving. Do not sit for a long time without getting up and moving around. If exercises (physical therapy) were prescribed, do them as told by your doctor. Try to strengthen your back and belly with exercises such as swimming or walking. General  instructions Do not smoke or use any products that contain nicotine or tobacco. If you need help quitting, ask your doctor. Do not wear high-heeled shoes. Do not sleep on your belly. If you are overweight, work with your doctor to lose weight safely. Keep all follow-up visits. How is this prevented? Stay at a healthy weight. Stay in shape. Do at least 150 minutes of moderate-intensity exercise each week, such as fast walking or water aerobics. When lifting objects: Keep your feet as far apart as your shoulders or farther apart. Tighten your belly muscles. Bend your knees and hips, and keep your spine neutral. Lift using the strength of your legs, not your back. Do not lock your knees straight out. Always ask for help to lift heavy or large objects. Contact a doctor if: You have back pain or neck pain that does not get better after 6 weeks. You have very bad pain in your back, neck, legs, or arms. You get any of these problems in any part of your body: Tingling. Weakness. Loss of feeling. Get help right away if: You cannot move your arms or legs. You cannot control when you pee or poop. You feel dizzy. You faint. You have trouble breathing. These symptoms may be an emergency. Get help right away. Call your local emergency services (911 in the [place] Do not wait to see if the symptoms will go away. Do not drive yourself to the hospital. Summary A herniated disk happens when a disk in your backbone bulges out too far. This condition may be caused by wear and tear as you age or a sudden injury. Symptoms may vary depending on where your herniated disk is in your body. Treatment may include rest, medicines, ice or heat therapy, steroid shots, and exercises. This information is not intended to replace advice given to you by your health care provider. Make sure you discuss any questions you have with your health care provider. Document Revised: 07/03/2019 Document Reviewed:  07/03/2019 Elsevier Patient Education  2022 Elsevier Inc.  

## 2021-05-31 ENCOUNTER — Telehealth: Payer: Self-pay | Admitting: Family Medicine

## 2021-05-31 DIAGNOSIS — M5416 Radiculopathy, lumbar region: Secondary | ICD-10-CM

## 2021-05-31 NOTE — Telephone Encounter (Signed)
Pt called. He wants to know his imaging results.  Thanks. ?

## 2021-06-01 NOTE — Telephone Encounter (Signed)
Xray shows mild/moderate degenerative changes of the lumbar spine at L4-L5, L5-S1 level.  MRI orders entered and will need to be authorized by insurance.    ? ?Thanks! ? ?CM

## 2021-06-01 NOTE — Telephone Encounter (Signed)
LVM for pt to call to discuss results.  T. Shaliah Wann, CMA  

## 2021-06-02 NOTE — Telephone Encounter (Signed)
Pt advised of results and MRI orders.  Pt inquired if he should wait until MRI is completed to play arena football.  Advised pt to wait until Dr. Ashley [facility]ty receives MRI results and makes recommendations.  Pt expressed understanding and is agreeable.  Tiajuana Amass, CMA ?

## 2021-06-08 ENCOUNTER — Telehealth: Payer: Self-pay

## 2021-06-08 NOTE — Telephone Encounter (Signed)
Contacted patient regarding location of MRI. Patient advised to contact his insurance for information about a new imaging center that is covered by his insurance. Current [facility] will become voided since patient is changing locations. Patient will return a call back to the clinic with the information of the preferred imaging location.    ?

## 2021-06-08 NOTE — Telephone Encounter (Signed)
Pt called stating MRI location that he was referred to does not take his insurance.  ? ?Requesting another location for imaging. Pt has Friday insurance.  ? ?Referral Coordinator updated. ?

## 2021-06-09 ENCOUNTER — Other Ambulatory Visit: Payer: Self-pay | Admitting: Family Medicine

## 2021-06-09 DIAGNOSIS — Z1389 Encounter for screening for other disorder: Secondary | ICD-10-CM

## 2021-06-09 DIAGNOSIS — Z0189 Encounter for other specified special examinations: Secondary | ICD-10-CM

## 2021-06-09 NOTE — Telephone Encounter (Signed)
Pt lvm stating insurance in-network Imaging @ [facility] [place] or  [facility] on [address]  ? ?Pt unable to be seen at [facility] due to table weight limit of 350lb. ? ?Imaging Coordinator Jeri Lager has been advised and is contacting [facility].  ?

## 2021-06-12 ENCOUNTER — Other Ambulatory Visit: Payer: Self-pay

## 2021-06-12 ENCOUNTER — Ambulatory Visit (INDEPENDENT_AMBULATORY_CARE_PROVIDER_SITE_OTHER): Payer: 59

## 2021-06-12 DIAGNOSIS — Z1389 Encounter for screening for other disorder: Secondary | ICD-10-CM

## 2021-06-12 DIAGNOSIS — M5416 Radiculopathy, lumbar region: Secondary | ICD-10-CM | POA: Diagnosis not present

## 2021-09-01 ENCOUNTER — Ambulatory Visit: Payer: 59 | Admitting: Family Medicine

## 2021-09-06 ENCOUNTER — Encounter: Payer: Self-pay | Admitting: Family Medicine

## 2021-09-06 ENCOUNTER — Ambulatory Visit (INDEPENDENT_AMBULATORY_CARE_PROVIDER_SITE_OTHER): Payer: 59 | Admitting: Family Medicine

## 2021-09-06 VITALS — BP 138/86 | HR 71 | Ht 78.0 in | Wt 362.0 lb

## 2021-09-06 DIAGNOSIS — I10 Essential (primary) hypertension: Secondary | ICD-10-CM | POA: Diagnosis not present

## 2021-09-06 DIAGNOSIS — R0683 Snoring: Secondary | ICD-10-CM

## 2021-09-06 DIAGNOSIS — Z13220 Encounter for screening for lipoid disorders: Secondary | ICD-10-CM | POA: Diagnosis not present

## 2021-09-06 DIAGNOSIS — R400 Somnolence: Secondary | ICD-10-CM

## 2021-09-06 DIAGNOSIS — I83812 Varicose veins of left lower extremities with pain: Secondary | ICD-10-CM

## 2021-09-06 DIAGNOSIS — Z114 Encounter for screening for human immunodeficiency virus [HIV]: Secondary | ICD-10-CM | POA: Insufficient documentation

## 2021-09-06 DIAGNOSIS — M5416 Radiculopathy, lumbar region: Secondary | ICD-10-CM

## 2021-09-06 MED ORDER — VALSARTAN 160 MG PO TABS: 160.0000 mg | ORAL_TABLET | Freq: Every day | ORAL | 3 refills | Status: DC

## 2021-09-06 NOTE — Patient Instructions (Signed)
Continue blood pressure medication.  I have entered another referral for sleep study.  We'll be in touch with lab results   Try compression socks/stockings to help support varicose veins.

## 2021-09-06 NOTE — Assessment & Plan Note (Signed)
-

## 2021-09-06 NOTE — Progress Notes (Signed)
Dakota Harmening - 31 y.o. male MRN [id number]  Date of birth: 03-16-1991  Subjective Chief Complaint  Patient presents with   Hypertension    HPI Dakota Wolf is a 31 y.o. male here today for follow up visit.  Reports that he is doing well.  BP is a little elevated today.  Took medication just prior to walking in the clinic today.  He denies side effects from medication.  He has not had chest pain, shortness of breath, palpitations, headache or vision changes.  He continues to have some daytime fatigue and snoring.  He never scheduled previous sleep study.   His back pain has improved at this point.  He has been careful about lifting too much.   He is having some irritation and itching along with some pain at times related to varicose veins.  He would like to see vascular surgery to discuss options for treatment.   ROS:  A comprehensive ROS was completed and negative except as noted per HPI    No Known Allergies  Past Medical History:  Diagnosis Date   Hypertension     History reviewed. No pertinent surgical history.  Social History   Socioeconomic History   Marital status: Single    Spouse name: Not on file   Number of children: 0   Years of education: Not on file   Highest education level: Bachelor's degree (e.g., BA, AB, BS)  Occupational History   Not on file  Tobacco Use   Smoking status: Light Smoker    Types: Cigars   Smokeless tobacco: Never   Tobacco comments:    07/02/20 occas cigar  Vaping Use   Vaping Use: Some days   Substances: THC  Substance and Sexual Activity   Alcohol use: Yes    Alcohol/week: 2.0 - 3.0 standard drinks of alcohol    Types: 2 - 3 Cans of beer per week   Drug use: No   Sexual activity: Yes    Partners: Female    Birth control/protection: Condom  Other Topics Concern   Not on file  Social History Narrative   Caffeine- 20 oz coffee, 2 sodas daily   Social Determinants of Health   Financial Resource Strain: Not on file  Food  Insecurity: Not on file  Transportation Needs: Not on file  Physical Activity: Not on file  Stress: Not on file  Social Connections: Not on file    Family History  Problem Relation Age of Onset   Hypertension Father    Other Maternal Uncle    Hypertension Maternal Grandmother    Other Cousin     Health Maintenance  Topic Date Due   COVID-19 Vaccine (1) 03/19/2022 (Originally 06/06/1990)   Hepatitis C Screening  09/07/2022 (Originally 12/07/2007)   INFLUENZA VACCINE  10/26/2021   TETANUS/TDAP  11/27/2028   HIV Screening  Completed   HPV VACCINES  Aged Out     ----------------------------------------------------------------------------------------------------------------------------------------------------------------------------------------------------------------- Physical Exam BP 138/86 (BP Location: Left Arm, Patient Position: Sitting, Cuff Size: Large)   Pulse 71   Ht 6\' 6"  (1.981 m)   Wt (!) 362 lb (164.2 kg)   SpO2 98%   BMI 41.83 kg/m   Physical Exam Constitutional:      Appearance: Normal appearance.  Eyes:     General: No scleral icterus. Cardiovascular:     Rate and Rhythm: Normal rate and regular rhythm.  Pulmonary:     Effort: Pulmonary effort is normal.     Breath sounds: Normal breath sounds.  Musculoskeletal:     Cervical back: Neck supple.  Neurological:     Mental Status: He is alert.  Psychiatric:        Mood and Affect: Mood normal.        Behavior: Behavior normal.     ------------------------------------------------------------------------------------------------------------------------------------------------------------------------------------------------------------------- Assessment and Plan  Essential hypertension BP is fairly well controlled with valsartan at current strength.  We'll plan to continue.  Updated labs ordered.  Continue to work on diet and exercise.    Lumbar radiculitis Improved at this time.   Discussed precautions  with lifting.   Daytime somnolence Referral entered again for sleep study.   Screening for HIV (human immunodeficiency virus) HIV antibody ordered  Varicose veins of left lower extremity with pain Recommend using compression stockings.  Referral placed to vascular as well.    No orders of the defined types were placed in this encounter.   No follow-ups on file.    This visit occurred during the SARS-CoV-2 public health emergency.  Safety protocols were in place, including screening questions prior to the visit, additional usage of staff PPE, and extensive cleaning of exam room while observing appropriate contact time as indicated for disinfecting solutions.

## 2021-09-06 NOTE — Assessment & Plan Note (Signed)
Improved at this time.   Discussed precautions with lifting.

## 2021-09-06 NOTE — Addendum Note (Signed)
Addended by: Luetta Nutting E on: 09/06/2021 09:00 AM   Modules accepted: Orders

## 2021-09-06 NOTE — Assessment & Plan Note (Signed)
Referral entered again for sleep study.

## 2021-09-06 NOTE — Assessment & Plan Note (Signed)
BP is fairly well controlled with valsartan at current strength.  We'll plan to continue.  Updated labs ordered.  Continue to work on diet and exercise.

## 2021-09-06 NOTE — Assessment & Plan Note (Signed)
Recommend using compression stockings.  Referral placed to vascular as well.

## 2021-09-07 LAB — CBC WITH DIFFERENTIAL/PLATELET
Absolute Monocytes: 515 cells/uL (ref 200–950)
Basophils Absolute: 31 cells/uL (ref 0–200)
Basophils Relative: 0.5 %
Eosinophils Absolute: 99 cells/uL (ref 15–500)
Eosinophils Relative: 1.6 %
HCT: 46.1 % (ref 38.5–50.0)
Hemoglobin: 15.4 g/dL (ref 13.2–17.1)
Lymphs Abs: 1606 cells/uL (ref 850–3900)
MCH: 30.7 pg (ref 27.0–33.0)
MCHC: 33.4 g/dL (ref 32.0–36.0)
MCV: 91.8 fL (ref 80.0–100.0)
MPV: 11.1 fL (ref 7.5–12.5)
Monocytes Relative: 8.3 %
Neutro Abs: 3949 cells/uL (ref 1500–7800)
Neutrophils Relative %: 63.7 %
Platelets: 217 10*3/uL (ref 140–400)
RBC: 5.02 10*6/uL (ref 4.20–5.80)
RDW: 12.3 % (ref 11.0–15.0)
Total Lymphocyte: 25.9 %
WBC: 6.2 10*3/uL (ref 3.8–10.8)

## 2021-09-07 LAB — LIPID PANEL W/REFLEX DIRECT LDL
Cholesterol: 236 mg/dL — ABNORMAL HIGH (ref <200)
HDL: 36 mg/dL — ABNORMAL LOW (ref >=40)
LDL Cholesterol (Calc): 165 mg/dL (calc) — ABNORMAL HIGH
Non-HDL Cholesterol (Calc): 200 mg/dL (calc) — ABNORMAL HIGH (ref <130)
Total CHOL/HDL Ratio: 6.6 (calc) — ABNORMAL HIGH (ref <5.0)
Triglycerides: 190 mg/dL — ABNORMAL HIGH (ref <150)

## 2021-09-07 LAB — COMPLETE METABOLIC PANEL WITH GFR
AG Ratio: 1.8 (calc) (ref 1.0–2.5)
ALT: 73 U/L — ABNORMAL HIGH (ref 9–46)
AST: 38 U/L (ref 10–40)
Albumin: 4.4 g/dL (ref 3.6–5.1)
Alkaline phosphatase (APISO): 69 U/L (ref 36–130)
BUN: 12 mg/dL (ref 7–25)
CO2: 26 mmol/L (ref 20–32)
Calcium: 9.3 mg/dL (ref 8.6–10.3)
Chloride: 103 mmol/L (ref 98–110)
Creat: 0.83 mg/dL (ref 0.60–1.26)
Globulin: 2.5 g/dL (calc) (ref 1.9–3.7)
Glucose, Bld: 88 mg/dL (ref 65–99)
Potassium: 4.2 mmol/L (ref 3.5–5.3)
Sodium: 141 mmol/L (ref 135–146)
Total Bilirubin: 1.3 mg/dL — ABNORMAL HIGH (ref 0.2–1.2)
Total Protein: 6.9 g/dL (ref 6.1–8.1)
eGFR: 120 mL/min/{1.73_m2} (ref >=60)

## 2021-09-07 LAB — HIV ANTIBODY (ROUTINE TESTING W REFLEX): HIV 1&2 Ab, 4th Generation: NONREACTIVE

## 2021-09-07 LAB — TSH: TSH: 2.58 mIU/L (ref 0.40–4.50)

## 2021-12-02 ENCOUNTER — Other Ambulatory Visit (HOSPITAL_COMMUNITY): Payer: Self-pay | Admitting: Family Medicine

## 2021-12-02 DIAGNOSIS — I83892 Varicose veins of left lower extremities with other complications: Secondary | ICD-10-CM

## 2021-12-08 NOTE — Progress Notes (Signed)
[facility]           OF [place]  History and Physical   Dakota Wolf is a 31 y.o. male who presents with LLE swelling and varicose veins.   He was seen by Dr. Ashley Royalty her PCP in June and had complaints about irritation and itching associated with varicose veins and was referred to Vascular Surgery for further evaluation.  It was recommended for him to wear compression socks.   He comes in today with LLE swelling, aching, pain and itching over his varicose veins.  He states he does have family hx and almost always it is the left leg.    He does not wear compression.  He has never had any DVT.  He has never had any procedures on his legs.    He smokes occasional cigars.   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   The pt is not on a statin for cholesterol management.  The pt is not on a daily aspirin.   Other AC:  none The pt is on ARB for hypertension.   The pt is not diabetic.    Pt does not have family hx of AAA.  Past Medical History:  Diagnosis Date   Hypertension     No past surgical history on file.  Social History   Socioeconomic History   Marital status: Single    Spouse name: Not on file   Number of children: 0   Years of education: Not on file   Highest education level: Bachelor's degree (e.g., BA, AB, BS)  Occupational History   Not on file  Tobacco Use   Smoking status: Light Smoker    Types: Cigars   Smokeless tobacco: Never   Tobacco comments:    07/02/20 occas cigar  Vaping Use   Vaping Use: Some days   Substances: THC  Substance and Sexual Activity   Alcohol use: Yes    Alcohol/week: 2.0 - 3.0 standard drinks of alcohol    Types: 2 - 3 Cans of beer per week   Drug use: No   Sexual activity: Yes    Partners: Female    Birth control/protection: Condom  Other Topics Concern   Not on  file  Social History Narrative   Caffeine- 20 oz coffee, 2 sodas daily   Social Determinants of Health   Financial Resource Strain: Not on file  Food Insecurity: Not on file  Transportation Needs: Not on file  Physical Activity: Not on file  Stress: Not on file  Social Connections: Not on file  Intimate Partner Violence: Not on file     Family History  Problem Relation Age of Onset   Hypertension Father    Other Maternal Uncle    Hypertension Maternal Grandmother    Other Cousin     Current Outpatient Medications  Medication Sig Dispense Refill   valsartan (DIOVAN) 160 MG tablet Take 1 tablet (160 mg total) by mouth daily. 90 tablet 3   No current facility-administered medications for this visit.    No Known Allergies  REVIEW OF SYSTEMS:   [X]  denotes positive finding, [ ]  denotes negative finding Cardiac  Comments:  Chest pain or chest pressure:    Shortness of breath upon exertion:    Short of breath when lying flat:  Irregular heart rhythm:        Vascular    Pain in calf, thigh, or hip brought on by ambulation:    Pain in feet at night that wakes you up from your sleep:     Blood clot in your veins:    Leg swelling:  x See HPI      Pulmonary    Oxygen at home:    Productive cough:     Wheezing:         Neurologic    Sudden weakness in arms or legs:     Sudden numbness in arms or legs:     Sudden onset of difficulty speaking or slurred speech:    Temporary loss of vision in one eye:     Problems with dizziness:         Gastrointestinal    Blood in stool:     Vomited blood:         Genitourinary    Burning when urinating:     Blood in urine:        Psychiatric    Major depression:         Hematologic    Bleeding problems:    Problems with blood clotting too easily:        Skin    Rashes or ulcers:        Constitutional    Fever or chills:      PHYSICAL EXAMINATION:  Today's Vitals   12/10/21 1102  BP: (!) 147/92  Pulse: 87   Resp: 18  Temp: (!) 97.3 F (36.3 C)  TempSrc: Temporal  SpO2: 99%  Weight: (!) 366 lb (166 kg)  Height: 6\' 7"  (2.007 m)  PainSc: 7    Body mass index is 41.23 kg/m.   General:  WDWN in NAD; vital signs documented above Gait: Not observed HENT: WNL, normocephalic Pulmonary: normal non-labored breathing without wheezing Cardiac: regular HR; without carotid bruits Abdomen: soft, NT, aortic pulse is not palpable Skin: without rashes Vascular Exam/Pulses:  Right Left  Radial 2+ (normal) 2+ (normal)  DP 2+ (normal) Triphasic doppler 2+ (normal) Triphasic doppler  PT Triphasic doppler Triphasic doppler   Extremities: + varicose veins LLE with swelling  Neurologic: A&O X 3;  moving all extremities equally Psychiatric:  The pt has Normal affect.   Non-Invasive Vascular Imaging:   Venous duplex on 12/09/2021: +--------------+---------+------+-----------+------------+--------+  LEFT          Reflux NoRefluxReflux TimeDiameter cmsComments                          Yes                                   +--------------+---------+------+-----------+------------+--------+  CFV           no                                              +--------------+---------+------+-----------+------------+--------+  FV prox       no                                              +--------------+---------+------+-----------+------------+--------+  FV mid        no                                              +--------------+---------+------+-----------+------------+--------+  FV dist       no                                              +--------------+---------+------+-----------+------------+--------+  Popliteal     no                                              +--------------+---------+------+-----------+------------+--------+  GSV at SFJ              yes    >500 ms      0.73               +--------------+---------+------+-----------+------------+--------+  GSV prox thigh          yes    >500 ms      0.78              +--------------+---------+------+-----------+------------+--------+  GSV mid thigh           yes    >500 ms      0.92              +--------------+---------+------+-----------+------------+--------+  GSV dist thigh          yes    >500 ms      0.89              +--------------+---------+------+-----------+------------+--------+  GSV at knee             yes    >500 ms      0.95              +--------------+---------+------+-----------+------------+--------+  GSV mid calf            yes    >500 ms      0.63              +--------------+---------+------+-----------+------------+--------+  SSV prox calf no                            0.42              +--------------+---------+------+-----------+------------+--------+   Summary:  Left:  - No evidence of deep vein thrombosis seen in the left lower extremity, from the common femoral through the popliteal veins.  - No evidence of superficial venous thrombosis in the left lower extremity.  - Venous reflux is noted in the left greater saphenous vein in the thigh.  - Venous reflux is noted in the left greater saphenous vein in the calf.    Dakota Brislin is a 31 y.o. male who presents with: LLE swelling and varicose veins    -pt has palpable pedal pulses and triphasic doppler flow bilateral DP/PT -pt does not have evidence of DVT.  Pt does not have venous reflux in the deep venous system on the left, but does have venous reflux in the GSV throughout the LLE and at the [facility] and measures 0.63cm-0.95cm.   -  pt with LLE swelling/varicose veins and family hx of left leg swelling.  May consider May Thurner. -discussed with pt about wearing thigh high 20-30 mmHg compression stockings and pt was measured for these today and did get a pair.   -discussed the importance of leg elevation and  how to elevate properly - pt is advised to elevate their legs and a diagram is given to them to demonstrate for pt to lay flat on their back with knees elevated and slightly bent with their feet higher than their knees, which puts their feet higher than their heart for 15 minutes per day.  If pt cannot lay flat, advised to lay as flat as possible.  -pt is advised to continue as much walking as possible and avoid sitting or standing for long periods of time.  -discussed importance of weight loss and exercise and that water aerobics would also be beneficial.  -handout with recommendations given -pt will f/u 3 months with MD to further discuss laser ablation.   Doreatha Massed, [facility] [phone]  Clinic MD:  Karin Lieu

## 2021-12-09 ENCOUNTER — Ambulatory Visit (HOSPITAL_COMMUNITY)
Admission: RE | Admit: 2021-12-09 | Discharge: 2021-12-09 | Disposition: A | Discharge status: Home / Self Care | Payer: Commercial Managed Care - HMO | Source: Ambulatory Visit | Attending: Cardiology | Admitting: Cardiology

## 2021-12-09 DIAGNOSIS — I83892 Varicose veins of left lower extremities with other complications: Secondary | ICD-10-CM | POA: Insufficient documentation

## 2021-12-10 ENCOUNTER — Encounter: Payer: Self-pay | Admitting: Physician Assistant

## 2021-12-10 ENCOUNTER — Encounter (HOSPITAL_COMMUNITY): Payer: 59

## 2021-12-10 ENCOUNTER — Ambulatory Visit: Payer: Commercial Managed Care - HMO | Admitting: Physician Assistant

## 2021-12-10 VITALS — BP 147/92 | HR 87 | Temp 97.3°F | Resp 18 | Ht 79.0 in | Wt 366.0 lb

## 2021-12-10 DIAGNOSIS — M7989 Other specified soft tissue disorders: Secondary | ICD-10-CM | POA: Diagnosis not present

## 2021-12-10 DIAGNOSIS — I8393 Asymptomatic varicose veins of bilateral lower extremities: Secondary | ICD-10-CM

## 2022-03-01 ENCOUNTER — Encounter: Payer: Self-pay | Admitting: Family Medicine

## 2022-03-01 ENCOUNTER — Ambulatory Visit (INDEPENDENT_AMBULATORY_CARE_PROVIDER_SITE_OTHER): Payer: Commercial Managed Care - HMO | Admitting: Family Medicine

## 2022-03-01 VITALS — BP 138/89 | HR 83 | Ht 79.0 in | Wt 358.0 lb

## 2022-03-01 DIAGNOSIS — I10 Essential (primary) hypertension: Secondary | ICD-10-CM

## 2022-03-01 NOTE — Progress Notes (Signed)
Dakota Wolf - 31 y.o. male MRN [id number]  Date of birth: [birth year]  Subjective Chief Complaint  Patient presents with   Hypertension    HPI Dakota Wolf is a 31 y.o. male here today for follow up of HTN.  Continues on valsartan 160mg  daily.  He is tolerating well without significant side effects.  Diet has improved some.  He is working out regularly.  He has not had symptoms related to his HTN including chest pain, shortness of breath, palpitations, headache or vision changes.   ROS:  A comprehensive ROS was completed and negative except as noted per HPI  No Known Allergies  Past Medical History:  Diagnosis Date   Hypertension     History reviewed. No pertinent surgical history.  Social History   Socioeconomic History   Marital status: Single    Spouse name: Not on file   Number of children: 0   Years of education: Not on file   Highest education level: Bachelor's degree (e.g., BA, AB, BS)  Occupational History   Not on file  Tobacco Use   Smoking status: Light Smoker    Types: Cigars   Smokeless tobacco: Never   Tobacco comments:    07/02/20 occas cigar  Vaping Use   Vaping Use: Some days   Substances: THC  Substance and Sexual Activity   Alcohol use: Yes    Alcohol/week: 2.0 - 3.0 standard drinks of alcohol    Types: 2 - 3 Cans of beer per week   Drug use: No   Sexual activity: Yes    Partners: Female    Birth control/protection: Condom  Other Topics Concern   Not on file  Social History Narrative   Caffeine- 20 oz coffee, 2 sodas daily   Social Determinants of Health   Financial Resource Strain: Not on file  Food Insecurity: Not on file  Transportation Needs: Not on file  Physical Activity: Not on file  Stress: Not on file  Social Connections: Not on file    Family History  Problem Relation Age of Onset   Hypertension Father    Other Maternal Uncle    Hypertension Maternal Grandmother    Other Cousin     Health Maintenance  Topic Date Due    COVID-19 Vaccine (1) 03/19/2022 (Originally 06[birth year]1990)   INFLUENZA VACCINE  06/26/2022 (Originally 10/26/2021)   Hepatitis C Screening  09/07/2022 (Originally 12/07/2007)   DTaP/Tdap/Td (2 - Td or Tdap) 11/27/2028   HIV Screening  Completed   HPV VACCINES  Aged Out     ----------------------------------------------------------------------------------------------------------------------------------------------------------------------------------------------------------------- Physical Exam BP 138/89 (BP Location: Left Arm, Patient Position: Sitting, Cuff Size: Large)   Pulse 83   Ht 6\' 7"  (2.007 m)   Wt (!) 358 lb (162.4 kg)   SpO2 98%   BMI 40.33 kg/m   Physical Exam Constitutional:      Appearance: Normal appearance.  HENT:     Head: Normocephalic and atraumatic.  Cardiovascular:     Rate and Rhythm: Normal rate and regular rhythm.  Musculoskeletal:     Cervical back: Neck supple.  Neurological:     Mental Status: He is alert.     ------------------------------------------------------------------------------------------------------------------------------------------------------------------------------------------------------------------- Assessment and Plan  Essential hypertension BP remains fairly well controlled.  Continue to work on diet and lifestyle change.  Discussed possibly adding zepbound in the future, he will consider this. Continue  valsartan at current strength.     No orders of the defined types were placed in this encounter.   Return in  about 3 months (around 05/31/2022) for Annual exam/Fasting labs.    This visit occurred during the SARS-CoV-2 public health emergency.  Safety protocols were in place, including screening questions prior to the visit, additional usage of staff PPE, and extensive cleaning of exam room while observing appropriate contact time as indicated for disinfecting solutions.

## 2022-03-01 NOTE — Assessment & Plan Note (Signed)
BP remains fairly well controlled.  Continue to work on diet and lifestyle change.  Discussed possibly adding zepbound in the future, he will consider this. Continue  valsartan at current strength.

## 2022-04-13 ENCOUNTER — Ambulatory Visit: Payer: Commercial Managed Care - HMO | Admitting: Vascular Surgery

## 2022-04-20 ENCOUNTER — Encounter: Payer: Self-pay | Admitting: Vascular Surgery

## 2022-04-20 ENCOUNTER — Ambulatory Visit: Payer: Commercial Managed Care - HMO | Admitting: Vascular Surgery

## 2022-04-20 VITALS — BP 149/108 | HR 76 | Temp 98.2°F | Resp 20 | Ht 79.0 in | Wt 345.0 lb

## 2022-04-20 DIAGNOSIS — I872 Venous insufficiency (chronic) (peripheral): Secondary | ICD-10-CM

## 2022-04-20 NOTE — Progress Notes (Signed)
Patient ID: Dakota Wolf, male   DOB: [birth year], 32 y.o.   MRN: [id number]  Reason for Consult: Follow-up   Referred by Luetta Nutting, DO  Subjective:     HPI:  Dakota Wolf is a 32 y.o. male with a very strong family history of left lower extremity varicose veins on his mother side.  He denies any history of DVT has never had any venous procedures himself.  He takes an ARB for hypertension no other medications.  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  Left lower extremity varicosities associated with pain and edema.  He has been compliant with lower extremity stockings although not wearing them today for evaluation.  He does not have any issues with the right lower extremity.  Past Medical History:  Diagnosis Date   Hypertension    Family History  Problem Relation Age of Onset   Hypertension Father    Other Maternal Uncle    Hypertension Maternal Grandmother    Other Cousin    History reviewed. No pertinent surgical history.  Short Social History:  Social History   Tobacco Use   Smoking status: Light Smoker    Types: Cigars   Smokeless tobacco: Never   Tobacco comments:    07/02/20 occas cigar  Substance Use Topics   Alcohol use: Yes    Alcohol/week: 2.0 - 3.0 standard drinks of alcohol    Types: 2 - 3 Cans of beer per week    No Known Allergies  Current Outpatient Medications  Medication Sig Dispense Refill   valsartan (DIOVAN) 160 MG tablet Take 1 tablet (160 mg total) by mouth daily. 90 tablet 3   No current facility-administered medications for this visit.    Review of Systems  Constitutional:  Constitutional negative. HENT: HENT negative.  Eyes: Eyes negative.  Respiratory: Respiratory negative.  Cardiovascular: Positive for leg swelling.  GI: Gastrointestinal negative.  Musculoskeletal: Musculoskeletal negative.  Skin: Skin negative.  Neurological: Neurological negative. Hematologic:  Hematologic/lymphatic negative.  Psychiatric: Psychiatric negative.        Objective:  Objective   Vitals:   04/20/22 0834  BP: (!) 149/108  Pulse: 76  Resp: 20  Temp: 98.2 F (36.8 C)  SpO2: 98%  Weight: (!) 345 lb (156.5 kg)  Height: 6\' 7"  (2.007 m)   Body mass index is 38.87 kg/m.  Physical Exam HENT:     Head: Normocephalic.     Nose: Nose normal.     Mouth/Throat:     Mouth: Mucous membranes are moist.  Eyes:     Pupils: Pupils are equal, round, and reactive to light.  Cardiovascular:     Rate and Rhythm: Normal rate.     Pulses: Normal pulses.  Pulmonary:     Effort: Pulmonary effort is normal.  Abdominal:     General: Abdomen is flat.     Palpations: Abdomen is soft.  Musculoskeletal:        General: Normal range of motion.     Cervical back: Normal range of motion.     Right lower leg: No edema.     Left lower leg: Edema present.  Skin:    General: Skin is warm.     Capillary Refill: Capillary refill takes less than 2 seconds.  Neurological:     General: No focal deficit present.     Mental Status: He is alert.  Psychiatric:        Mood and Affect: Mood normal.  Behavior: Behavior normal.        Thought Content: Thought content normal.        Judgment: Judgment normal.        Data: LEFT          Reflux NoRefluxReflux TimeDiameter cmsComments                          Yes                                   +--------------+---------+------+-----------+------------+--------+  CFV          no                                              +--------------+---------+------+-----------+------------+--------+  FV prox       no                                              +--------------+---------+------+-----------+------------+--------+  FV mid        no                                              +--------------+---------+------+-----------+------------+--------+  FV dist       no                                               +--------------+---------+------+-----------+------------+--------+  Popliteal    no                                              +--------------+---------+------+-----------+------------+--------+  GSV at SFJ              yes    >500 ms      0.73              +--------------+---------+------+-----------+------------+--------+  GSV prox thigh          yes    >500 ms      0.78              +--------------+---------+------+-----------+------------+--------+  GSV mid thigh           yes    >500 ms      0.92              +--------------+---------+------+-----------+------------+--------+  GSV dist thigh          yes    >500 ms      0.89              +--------------+---------+------+-----------+------------+--------+  GSV at knee             yes    >500 ms      0.95              +--------------+---------+------+-----------+------------+--------+  GSV mid calf  yes    >500 ms      0.63              +--------------+---------+------+-----------+------------+--------+  SSV prox calf no                            0.42              +--------------+---------+------+-----------+------------+--------+    Summary:  Left:  - No evidence of deep vein thrombosis seen in the left lower extremity,  from the common femoral through the popliteal veins.  - No evidence of superficial venous thrombosis in the left lower  extremity.  - Venous reflux is noted in the left greater saphenous vein in the thigh.  - Venous reflux is noted in the left greater saphenous vein in the calf.       Assessment/Plan:    32 year old male with C3 venous disease left lower extremity with associated varicosities.  He has been compliant with left lower extremity thigh-high compression stockings.  I evaluated his varicose veins with ultrasound at the bedside as well as his great saphenous vein which enters the fascia in the mid thigh.  I  discussed with him great saphenous vein ablation from the mid thigh cephalad and then stab phlebectomy of the distal saphenous vein as well as at least 20 stab phlebectomies of multiple varicose veins throughout the medial left leg and anterior leg.  We discussed the risk and benefits including the risk of DVT and need for anticoagulation as well as need for follow-up 2 weeks after the procedure and the need for compression stockings postprocedure.  Patient demonstrates good understanding we will get him scheduled for the near future.    Waynetta Sandy MD [facility]

## 2022-05-04 ENCOUNTER — Encounter: Payer: Self-pay | Admitting: *Deleted

## 2022-05-13 ENCOUNTER — Other Ambulatory Visit: Payer: Self-pay | Admitting: *Deleted

## 2022-05-13 DIAGNOSIS — I83812 Varicose veins of left lower extremities with pain: Secondary | ICD-10-CM

## 2022-06-01 ENCOUNTER — Encounter: Payer: Commercial Managed Care - HMO | Admitting: Family Medicine

## 2022-06-02 ENCOUNTER — Encounter: Payer: Commercial Managed Care - HMO | Admitting: Family Medicine

## 2022-06-07 ENCOUNTER — Encounter: Payer: Self-pay | Admitting: Family Medicine

## 2022-06-07 ENCOUNTER — Ambulatory Visit (INDEPENDENT_AMBULATORY_CARE_PROVIDER_SITE_OTHER): Payer: Commercial Managed Care - HMO | Admitting: Family Medicine

## 2022-06-07 VITALS — BP 132/83 | HR 75 | Ht 79.0 in | Wt 359.0 lb

## 2022-06-07 DIAGNOSIS — I10 Essential (primary) hypertension: Secondary | ICD-10-CM

## 2022-06-07 DIAGNOSIS — R400 Somnolence: Secondary | ICD-10-CM | POA: Diagnosis not present

## 2022-06-07 DIAGNOSIS — Z13220 Encounter for screening for lipoid disorders: Secondary | ICD-10-CM | POA: Diagnosis not present

## 2022-06-07 DIAGNOSIS — Z0000 Encounter for general adult medical examination without abnormal findings: Secondary | ICD-10-CM | POA: Insufficient documentation

## 2022-06-07 DIAGNOSIS — Z114 Encounter for screening for human immunodeficiency virus [HIV]: Secondary | ICD-10-CM

## 2022-06-07 NOTE — Assessment & Plan Note (Signed)
Blood pressure is at goal at for age and co-morbidities.  I recommend continuation of diovan at current strength.  In addition they were instructed to follow a low sodium diet with regular exercise to help to maintain adequate control of blood pressure.

## 2022-06-07 NOTE — Patient Instructions (Signed)
Preventive Care 21-33 Years Old, Male Preventive care refers to lifestyle choices and visits with your health care provider that can promote health and wellness. Preventive care visits are also called wellness exams. What can I expect for my preventive care visit? Counseling During your preventive care visit, your health care provider may ask about your: Medical history, including: Past medical problems. Family medical history. Current health, including: Emotional well-being. Home life and relationship well-being. Sexual activity. Lifestyle, including: Alcohol, nicotine or tobacco, and drug use. Access to firearms. Diet, exercise, and sleep habits. Safety issues such as seatbelt and bike helmet use. Sunscreen use. Work and work environment. Physical exam Your health care provider may check your: Height and weight. These may be used to calculate your BMI (body mass index). BMI is a measurement that tells if you are at a healthy weight. Waist circumference. This measures the distance around your waistline. This measurement also tells if you are at a healthy weight and may help predict your risk of certain diseases, such as type 2 diabetes and high blood pressure. Heart rate and blood pressure. Body temperature. Skin for abnormal spots. What immunizations do I need?  Vaccines are usually given at various ages, according to a schedule. Your health care provider will recommend vaccines for you based on your age, medical history, and lifestyle or other factors, such as travel or where you work. What tests do I need? Screening Your health care provider may recommend screening tests for certain conditions. This may include: Lipid and cholesterol levels. Diabetes screening. This is done by checking your blood sugar (glucose) after you have not eaten for a while (fasting). Hepatitis B test. Hepatitis C test. HIV (human immunodeficiency virus) test. STI (sexually transmitted infection)  testing, if you are at risk. Talk with your health care provider about your test results, treatment options, and if necessary, the need for more tests. Follow these instructions at home: Eating and drinking  Eat a healthy diet that includes fresh fruits and vegetables, whole grains, lean protein, and low-fat dairy products. Drink enough fluid to keep your urine pale yellow. Take vitamin and mineral supplements as recommended by your health care provider. Do not drink alcohol if your health care provider tells you not to drink. If you drink alcohol: Limit how much you have to 0-2 drinks a day. Know how much alcohol is in your drink. In the [place] one drink equals one 12 oz bottle of beer (355 mL), one 5 oz glass of wine (148 mL), or one 1 oz glass of hard liquor (44 mL). Lifestyle Brush your teeth every morning and night with fluoride toothpaste. Floss one time each day. Exercise for at least 30 minutes 5 or more days each week. Do not use any products that contain nicotine or tobacco. These products include cigarettes, chewing tobacco, and vaping devices, such as e-cigarettes. If you need help quitting, ask your health care provider. Do not use drugs. If you are sexually active, practice safe sex. Use a condom or other form of protection to prevent STIs. Find healthy ways to manage stress, such as: Meditation, yoga, or listening to music. Journaling. Talking to a trusted person. Spending time with friends and family. Minimize exposure to UV radiation to reduce your risk of skin cancer. Safety Always wear your seat belt while driving or riding in a vehicle. Do not drive: If you have been drinking alcohol. Do not ride with someone who has been drinking. If you have been using any mind-altering substances   or drugs. While texting. When you are tired or distracted. Wear a helmet and other protective equipment during sports activities. If you have firearms in your house, make sure you  follow all gun safety procedures. Seek help if you have been physically or sexually abused. What's next? Go to your health care provider once a year for an annual wellness visit. Ask your health care provider how often you should have your eyes and teeth checked. Stay up to date on all vaccines. This information is not intended to replace advice given to you by your health care provider. Make sure you discuss any questions you have with your health care provider. Document Revised: 09/09/2020 Document Reviewed: 09/09/2020 Elsevier Patient Education  2023 [employer] Inc[employer]  

## 2022-06-07 NOTE — Assessment & Plan Note (Signed)
Well adult Orders Placed This Encounter  Procedures   CBC with Differential   COMPLETE METABOLIC PANEL WITH GFR   Lipid Panel w/reflex Direct LDL   TSH   Testosterone   HIV antibody (with reflex)   Home sleep test    Standing Status:   Future    Standing Expiration Date:   06/07/2023    Order Specific Question:   Where should this test be performed:    Answer:   [place]: Per lab orders Immunizations: Declines Anticipatory guidance/Risk factor reduction:  Recommendations per AVS.

## 2022-06-07 NOTE — Progress Notes (Signed)
Dakota Wolf - 32 y.o. male MRN [id number]  Date of birth: January 20, 1991  Subjective Chief Complaint  Patient presents with   Annual Exam    HPI Dakota Wolf is a 32 y.o. male here today for annual exam   He reports that he is doing pretty well.  Continues on diovan for management of HTN.  He would like to have sleep study re-ordered.  He is doing meal prep to help with weight management.  He remains pretty active.  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    He smokes a cigar occasionally.   Occasional EtOH intake.   Review of Systems  Constitutional:  Negative for chills, fever, malaise/fatigue and weight loss.  HENT:  Negative for congestion, ear pain and sore throat.   Eyes:  Negative for blurred vision, double vision and pain.  Respiratory:  Negative for cough and shortness of breath.   Cardiovascular:  Negative for chest pain and palpitations.  Gastrointestinal:  Negative for abdominal pain, blood in stool, constipation, heartburn and nausea.  Genitourinary:  Negative for dysuria and urgency.  Musculoskeletal:  Negative for joint pain and myalgias.  Neurological:  Negative for dizziness and headaches.  Endo/Heme/Allergies:  Does not bruise/bleed easily.  Psychiatric/Behavioral:  Negative for depression. The patient is not nervous/anxious and does not have insomnia.     No Known Allergies  Past Medical History:  Diagnosis Date   Hypertension     History reviewed. No pertinent surgical history.  Social History   Socioeconomic History   Marital status: Single    Spouse name: Not on file   Number of children: 0   Years of education: Not on file   Highest education level: Bachelor's degree (e.g., BA, AB, BS)  Occupational History   Not on file  Tobacco Use   Smoking status: Light Smoker    Types: Cigars   Smokeless tobacco: Never   Tobacco comments:    07/02/20 occas cigar  Vaping Use   Vaping Use: Some days   Substances: THC  Substance and  Sexual Activity   Alcohol use: Yes    Alcohol/week: 2.0 - 3.0 standard drinks of alcohol    Types: 2 - 3 Cans of beer per week   Drug use: No   Sexual activity: Yes    Partners: Female    Birth control/protection: Condom  Other Topics Concern   Not on file  Social History Narrative   Caffeine- 20 oz coffee, 2 sodas daily   Social Determinants of Health   Financial Resource Strain: Not on file  Food Insecurity: Not on file  Transportation Needs: Not on file  Physical Activity: Not on file  Stress: Not on file  Social Connections: Not on file    Family History  Problem Relation Age of Onset   Hypertension Father    Other Maternal Uncle    Hypertension Maternal Grandmother    Other [place] Maintenance  Topic Date Due   INFLUENZA VACCINE  06/26/2022 (Originally 10/26/2021)   Hepatitis C Screening  09/07/2022 (Originally 12/07/2007)   COVID-19 Vaccine (1) 06/23/2023 (Originally 06/06/1991)   DTaP/Tdap/Td (2 - Td or Tdap) 11/27/2028   HIV Screening  Completed   HPV VACCINES  Aged Out     ----------------------------------------------------------------------------------------------------------------------------------------------------------------------------------------------------------------- Physical Exam BP 132/83 (BP Location: Left Arm, Patient Position: Sitting, Cuff Size: Large)   Pulse 75   Ht '6\' 7"'$  (2.007 m)   Wt (!) 359 lb (162.8 kg)   SpO2  98%   BMI 40.44 kg/m   Physical Exam Constitutional:      General: He is not in acute distress. HENT:     Head: Normocephalic and atraumatic.     Right Ear: Tympanic membrane and external ear normal.     Left Ear: Tympanic membrane and external ear normal.  Eyes:     General: No scleral icterus. Neck:     Thyroid: No thyromegaly.  Cardiovascular:     Rate and Rhythm: Normal rate and regular rhythm.     Heart sounds: Normal heart sounds.  Pulmonary:     Effort: Pulmonary effort is normal.     Breath  sounds: Normal breath sounds.  Abdominal:     General: Bowel sounds are normal. There is no distension.     Palpations: Abdomen is soft.     Tenderness: There is no abdominal tenderness. There is no guarding.  Musculoskeletal:     Cervical back: Normal range of motion.  Lymphadenopathy:     Cervical: No cervical adenopathy.  Skin:    General: Skin is warm and dry.     Findings: No rash.  Neurological:     Mental Status: He is alert and oriented to person, place, and time.     Cranial Nerves: No cranial nerve deficit.     Motor: No abnormal muscle tone.  Psychiatric:        Mood and Affect: Mood normal.        Behavior: Behavior normal.     ------------------------------------------------------------------------------------------------------------------------------------------------------------------------------------------------------------------- Assessment and Plan  Essential hypertension Blood pressure is at goal at for age and co-morbidities.  I recommend continuation of diovan at current strength.  In addition they were instructed to follow a low sodium diet with regular exercise to help to maintain adequate control of blood pressure.    Well adult exam Well adult Orders Placed This Encounter  Procedures   CBC with Differential   COMPLETE METABOLIC PANEL WITH GFR   Lipid Panel w/reflex Direct LDL   TSH   Testosterone   HIV antibody (with reflex)   Home sleep test    Standing Status:   Future    Standing Expiration Date:   06/07/2023    Order Specific Question:   Where should this test be performed:    Answer:   [facility]: Per lab orders Immunizations: Declines Anticipatory guidance/Risk factor reduction:  Recommendations per AVS.    No orders of the defined types were placed in this encounter.   No follow-ups on file.    This visit occurred during the SARS-CoV-2 public health emergency.  Safety protocols were in place, including  screening questions prior to the visit, additional usage of staff PPE, and extensive cleaning of exam room while observing appropriate contact time as indicated for disinfecting solutions.

## 2022-06-10 LAB — CBC WITH DIFFERENTIAL/PLATELET
Absolute Monocytes: 456 cells/uL (ref 200–950)
Eosinophils Absolute: 121 cells/uL (ref 15–500)
RBC: 5.14 10*6/uL (ref 4.20–5.80)
RDW: 12 % (ref 11.0–15.0)
Total Lymphocyte: 22.1 %

## 2022-06-11 LAB — CBC WITH DIFFERENTIAL/PLATELET
Basophils Absolute: 40 cells/uL (ref 0–200)
Basophils Relative: 0.6 %
Eosinophils Relative: 1.8 %
MCH: 30.9 pg (ref 27.0–33.0)
MCHC: 34.1 g/dL (ref 32.0–36.0)
MCV: 90.7 fL (ref 80.0–100.0)
MPV: 11.4 fL (ref 7.5–12.5)
Monocytes Relative: 6.8 %
Neutrophils Relative %: 68.7 %
Platelets: 236 10*3/uL (ref 140–400)

## 2022-06-11 LAB — COMPLETE METABOLIC PANEL WITH GFR
ALT: 68 U/L — ABNORMAL HIGH (ref 9–46)
BUN: 16 mg/dL (ref 7–25)
CO2: 28 mmol/L (ref 20–32)
Calcium: 9.2 mg/dL (ref 8.6–10.3)
Glucose, Bld: 70 mg/dL (ref 65–99)
Potassium: 4.3 mmol/L (ref 3.5–5.3)
Sodium: 141 mmol/L (ref 135–146)
Total Protein: 7.5 g/dL (ref 6.1–8.1)
eGFR: 118 mL/min/{1.73_m2} (ref >=60)

## 2022-06-11 LAB — LIPID PANEL W/REFLEX DIRECT LDL
Cholesterol: 250 mg/dL — ABNORMAL HIGH (ref <200)
HDL: 35 mg/dL — ABNORMAL LOW (ref >=40)
Non-HDL Cholesterol (Calc): 215 mg/dL (calc) — ABNORMAL HIGH (ref <130)
Total CHOL/HDL Ratio: 7.1 (calc) — ABNORMAL HIGH (ref <5.0)
Triglycerides: 179 mg/dL — ABNORMAL HIGH (ref <150)

## 2022-06-11 LAB — TSH: TSH: 2.4 mIU/L (ref 0.40–4.50)

## 2022-06-13 LAB — CBC WITH DIFFERENTIAL/PLATELET
HCT: 46.6 % (ref 38.5–50.0)
Hemoglobin: 15.9 g/dL (ref 13.2–17.1)
Lymphs Abs: 1481 cells/uL (ref 850–3900)
Neutro Abs: 4603 cells/uL (ref 1500–7800)
WBC: 6.7 10*3/uL (ref 3.8–10.8)

## 2022-06-13 LAB — COMPLETE METABOLIC PANEL WITH GFR
AG Ratio: 1.5 (calc) (ref 1.0–2.5)
AST: 34 U/L (ref 10–40)
Albumin: 4.5 g/dL (ref 3.6–5.1)
Alkaline phosphatase (APISO): 78 U/L (ref 36–130)
Chloride: 104 mmol/L (ref 98–110)
Creat: 0.86 mg/dL (ref 0.60–1.26)
Globulin: 3 g/dL (calc) (ref 1.9–3.7)
Total Bilirubin: 1.3 mg/dL — ABNORMAL HIGH (ref 0.2–1.2)

## 2022-06-13 LAB — HIV ANTIBODY (ROUTINE TESTING W REFLEX): HIV 1&2 Ab, 4th Generation: NONREACTIVE

## 2022-06-13 LAB — LIPID PANEL W/REFLEX DIRECT LDL: LDL Cholesterol (Calc): 182 mg/dL (calc) — ABNORMAL HIGH

## 2022-06-13 LAB — TESTOSTERONE: Testosterone: 139 ng/dL — ABNORMAL LOW (ref 250–827)

## 2022-06-17 ENCOUNTER — Other Ambulatory Visit: Payer: Self-pay | Admitting: Family Medicine

## 2022-06-17 DIAGNOSIS — R7989 Other specified abnormal findings of blood chemistry: Secondary | ICD-10-CM

## 2022-06-23 LAB — FSH/LH
FSH: 3.2 m[IU]/mL (ref 1.4–12.8)
LH: 3.3 m[IU]/mL (ref 1.5–9.3)

## 2022-06-23 LAB — PROLACTIN: Prolactin: 5.1 ng/mL (ref 2.0–18.0)

## 2022-06-23 LAB — TESTOSTERONE: Testosterone: 166 ng/dL — ABNORMAL LOW (ref 250–827)

## 2022-06-24 ENCOUNTER — Other Ambulatory Visit: Payer: Self-pay | Admitting: Family Medicine

## 2022-06-24 DIAGNOSIS — E291 Testicular hypofunction: Secondary | ICD-10-CM

## 2022-06-28 ENCOUNTER — Encounter: Payer: Self-pay | Admitting: Family Medicine

## 2022-06-30 LAB — IRON,TIBC AND FERRITIN PANEL
%SAT: 41 % (calc) (ref 20–48)
Ferritin: 131 ng/mL (ref 38–380)
Iron: 138 ug/dL (ref 50–180)
TIBC: 335 mcg/dL (calc) (ref 250–425)

## 2022-06-30 LAB — CORTISOL: Cortisol, Plasma: 13.2 ug/dL

## 2022-06-30 LAB — T4, FREE: Free T4: 1.1 ng/dL (ref 0.8–1.8)

## 2022-07-01 ENCOUNTER — Ambulatory Visit (HOSPITAL_BASED_OUTPATIENT_CLINIC_OR_DEPARTMENT_OTHER): Payer: Commercial Managed Care - HMO | Attending: Family Medicine | Admitting: Internal Medicine

## 2022-07-01 DIAGNOSIS — G4733 Obstructive sleep apnea (adult) (pediatric): Secondary | ICD-10-CM | POA: Insufficient documentation

## 2022-07-01 DIAGNOSIS — R400 Somnolence: Secondary | ICD-10-CM | POA: Insufficient documentation

## 2022-07-05 ENCOUNTER — Encounter: Payer: Self-pay | Admitting: Family Medicine

## 2022-07-07 ENCOUNTER — Other Ambulatory Visit: Payer: Self-pay | Admitting: *Deleted

## 2022-07-07 MED ORDER — LORAZEPAM 1 MG PO TABS: ORAL_TABLET | ORAL | 0 refills | Status: DC

## 2022-07-08 ENCOUNTER — Telehealth (INDEPENDENT_AMBULATORY_CARE_PROVIDER_SITE_OTHER): Payer: Commercial Managed Care - HMO | Admitting: Family Medicine

## 2022-07-08 DIAGNOSIS — E291 Testicular hypofunction: Secondary | ICD-10-CM | POA: Diagnosis not present

## 2022-07-09 DIAGNOSIS — R400 Somnolence: Secondary | ICD-10-CM

## 2022-07-09 NOTE — Procedures (Signed)
   Patient Name: [name], Dakota Wolf Date: 07/03/2022 Gender: Male D.O.B: [birth year] Age (years): 31 Referring Provider: Elmarie Shiley MD Height (inches): 78 Interpreting Physician: Jetty Duhamel MD, ABSM Weight (lbs): 347 RPSGT: [place] BMI: 40 MRN: [id number] Neck Size: 18.75  CLINICAL INFORMATION Sleep Wolf Type: HST Indication for sleep Wolf: Snoring Epworth Sleepiness Score: 12  SLEEP Wolf TECHNIQUE A multi-channel overnight portable sleep Wolf was performed. The channels recorded were: nasal airflow, thoracic respiratory movement, and oxygen saturation with a pulse oximetry. Snoring was also monitored.  MEDICATIONS Patient self administered medications include: none reported.  SLEEP ARCHITECTURE Patient was studied for 363.5 minutes. The sleep efficiency was 100.0 % and the patient was supine for 0%. The arousal index was 0.0 per hour.  RESPIRATORY PARAMETERS The overall AHI was 59.4 per hour, with a central apnea index of 0 per hour. The oxygen nadir was 79% during sleep.  CARDIAC DATA Mean heart rate during sleep was 65.2 bpm.  IMPRESSIONS - Severe obstructive sleep apnea occurred during this Wolf (AHI = 59.4/h). - Oxygen desaturation was noted during this Wolf (Min O2 = 79%, Mean 95%). - Patient snored.  DIAGNOSIS - Obstructive Sleep Apnea (G47.33)  RECOMMENDATIONS - Suggest CPAP titration sleep Wolf or autopap. Other options would be based on clinical judgment. - Be careful with alcohol, sedatives and other CNS depressants that may worsen sleep apnea and disrupt normal sleep architecture. - Sleep hygiene should be reviewed to assess factors that may improve sleep quality. - Weight management and regular exercise should be initiated or continued.  [Electronically signed] 07/09/2022 11:47 AM  Jetty Duhamel MD, ABSM Diplomate, American Board of Sleep Medicine NPI: [id number]                          Jetty Duhamel Diplomate, American Board of Sleep Medicine  ELECTRONICALLY SIGNED ON:  07/09/2022, 11:43 AM [facility] PH: [phone]   FX: [phone] ACCREDITED BY THE AMERICAN ACADEMY OF SLEEP MEDICINE

## 2022-07-10 ENCOUNTER — Encounter: Payer: Self-pay | Admitting: Family Medicine

## 2022-07-10 DIAGNOSIS — E291 Testicular hypofunction: Secondary | ICD-10-CM | POA: Insufficient documentation

## 2022-07-10 NOTE — Progress Notes (Signed)
Dakota Obar - 32 y.o. male MRN [id number]  Date of birth: January 31, 1991   This visit type was conducted due to national recommendations for restrictions regarding the COVID-19 Pandemic (e.g. social distancing).  This format is felt to be most appropriate for this patient at this time.  All issues noted in this document were discussed and addressed.  No physical exam was performed (except for noted visual exam findings with Video Visits).  I discussed the limitations of evaluation and management by telemedicine and the availability of in person appointments. The patient expressed understanding and agreed to proceed.  I connected withNAME@ on 07/10/22 at 11:30 AM EDT by a video enabled telemedicine application and verified that I am speaking with the correct person using two identifiers.  Present at visit: Everrett Coombe, DO Dakota Wolf   Patient Location: Home [address] [place] [address]   Provider location:   [facility]  No chief complaint on file.   HPI  Dakota Lepage is a 32 y.o. male who presents via Web designer for a telehealth visit today.  Following up on recent labs.  Noted to have low testosterone on recent labs.  Additional workup was completed showing continued low testosterone levels with no other significant lab abnormalities.  He may possibly be interested in supplementation but is not sure.   ROS:  A comprehensive ROS was completed and negative except as noted per HPI  Past Medical History:  Diagnosis Date   Hypertension     No past surgical history on file.  Family History  Problem Relation Age of Onset   Hypertension Father    Other Maternal Uncle    Hypertension Maternal Grandmother    Other Cousin     Social History   Socioeconomic History   Marital status: Single    Spouse name: Not on file   Number of children: 0   Years of education: Not on file   Highest education level: Bachelor's degree (e.g., BA, AB, BS)  Occupational  History   Not on file  Tobacco Use   Smoking status: Light Smoker    Types: Cigars   Smokeless tobacco: Never   Tobacco comments:    07/02/20 occas cigar  Vaping Use   Vaping Use: Some days   Substances: THC  Substance and Sexual Activity   Alcohol use: Yes    Alcohol/week: 2.0 - 3.0 standard drinks of alcohol    Types: 2 - 3 Cans of beer per week   Drug use: No   Sexual activity: Yes    Partners: Female    Birth control/protection: Condom  Other Topics Concern   Not on file  Social History Narrative   Caffeine- 20 oz coffee, 2 sodas daily   Social Determinants of Health   Financial Resource Strain: Not on file  Food Insecurity: Not on file  Transportation Needs: Not on file  Physical Activity: Not on file  Stress: Not on file  Social Connections: Not on file  Intimate Partner Violence: Not on file     Current Outpatient Medications:    LORazepam (ATIVAN) 1 MG tablet, Take 2 tablets 30 minutes prior to leaving house on day of office surgery.  Bring third tablet with you to office on day of office surgery., Disp: 3 tablet, Rfl: 0   valsartan (DIOVAN) 160 MG tablet, Take 1 tablet (160 mg total) by mouth daily., Disp: 90 tablet, Rfl: 3  EXAM:  VITALS per patient if applicable: There were no vitals taken for  this visit.  GENERAL: alert, oriented, appears well and in no acute distress  HEENT: atraumatic, conjunttiva clear, no obvious abnormalities on inspection of external nose and ears  NECK: normal movements of the head and neck  LUNGS: on inspection no signs of respiratory distress, breathing rate appears normal, no obvious gross SOB, gasping or wheezing  CV: no obvious cyanosis  MS: moves all visible extremities without noticeable abnormality  PSYCH/NEURO: pleasant and cooperative, no obvious depression or anxiety, speech and thought processing grossly intact  ASSESSMENT AND PLAN:  Discussed the following assessment and plan:  Hypogonadism in male We  discussed low testosterone and ways to increase this.  He is interested in more natural ways of increasing this initially.  We discussed dietary changes as well as being sure to get adequate sleep and working on weight loss/reduction of body fat.  He will let me know if interested in supplementation later.       I discussed the assessment and treatment plan with the patient. The patient was provided an opportunity to ask questions and all were answered. The patient agreed with the plan and demonstrated an understanding of the instructions.   The patient was advised to call back or seek an in-person evaluation if the symptoms worsen or if the condition fails to improve as anticipated.    Everrett Coombe, DO

## 2022-07-10 NOTE — Assessment & Plan Note (Signed)
We discussed low testosterone and ways to increase this.  He is interested in more natural ways of increasing this initially.  We discussed dietary changes as well as being sure to get adequate sleep and working on weight loss/reduction of body fat.  He will let me know if interested in supplementation later.

## 2022-07-14 ENCOUNTER — Encounter: Payer: Self-pay | Admitting: Vascular Surgery

## 2022-07-14 ENCOUNTER — Ambulatory Visit: Payer: Commercial Managed Care - HMO | Admitting: Vascular Surgery

## 2022-07-14 VITALS — BP 131/90 | HR 76 | Temp 97.7°F | Resp 18 | Ht 78.0 in | Wt 351.0 lb

## 2022-07-14 DIAGNOSIS — I83812 Varicose veins of left lower extremities with pain: Secondary | ICD-10-CM

## 2022-07-14 HISTORY — PX: ENDOVENOUS ABLATION SAPHENOUS VEIN W/ LASER: SUR449

## 2022-07-14 NOTE — Progress Notes (Signed)
     Laser Ablation Procedure    Date: 07/14/2022   Dakota Wolf DOB:December 04, 1990  Consent signed: Yes      Surgeon: Lemar Livings MD   Procedure: Laser Ablation: left Greater Saphenous Vein  BP (!) 131/90 (BP Location: Left Arm, Patient Position: Sitting, Cuff Size: Large)   Pulse 76   Temp 97.7 F (36.5 C) (Temporal)   Resp 18   Ht  (1.981 m)   Wt (!) 351 lb (159.2 kg)   SpO2 100%   BMI 40.56 kg/m   Tumescent Anesthesia: 750 cc 0.9% NaCl with 50 cc Lidocaine HCL 1%  and 15 cc 8.4% NaHCO3  Local Anesthesia: 20 cc Lidocaine HCL and NaHCO3 (ratio 2:1)  7 watts continuous mode     Total energy: 1604 Joules     Total time: 229 seconds  Treatment Length 32 cm   Laser Fiber Ref. #  [id number]   Lot # [id number]   Stab Phlebectomy: >20 Sites: Thigh and Calf  Patient tolerated procedure well  Notes:  All staff members wore facial masks. [name] took Ativan 1 mg (2 tablets) on 07-14-2022 at 7:45 AM and Ativan 1 mg (1 tablet) at 8:30 AM.    Description of Procedure:  After marking the course of the secondary varicosities, the patient was placed on the operating table in the supine position, and the left leg was prepped and draped in sterile fashion.   Local anesthetic was administered and under ultrasound guidance the saphenous vein was accessed with a micro needle and guide wire; then the mirco puncture sheath was placed.  A guide wire was inserted saphenofemoral junction , followed by a 5 french sheath.  The position of the sheath and then the laser fiber below the junction was confirmed using the ultrasound.  Tumescent anesthesia was administered along the course of the saphenous vein using ultrasound guidance. The patient was placed in Trendelenburg position and protective laser glasses were placed on patient and staff, and the laser was fired at 7 watts continuous mode for a total of 1604 Joules.     For stab phlebectomies, local anesthetic was administered at the  previously marked varicosities, and tumescent anesthesia was administered around the vessels.  Greater than 20 stab wounds were made using the tip of an 11 blade. And using the vein hook, the phlebectomies were performed using a hemostat to avulse the varicosities.  Adequate hemostasis was achieved.     Steri strips were applied to the stab wounds and ABD pads and thigh high compression stockings were applied.  Ace wrap bandages were applied over the phlebectomy sites and at the top of the saphenofemoral junction. Blood loss was less than 15 cc.  Discharge instructions reviewed with patient and hardcopy of discharge instructions given to patient to take home. The patient was taken to his mom's car via wheelchair out of the operating room having tolerated the procedure well.

## 2022-07-14 NOTE — Progress Notes (Signed)
Patient name: Dakota Wolf MRN: [id number] DOB: September 03, 1990 Sex: male  REASON FOR VISIT: Patient presents for left great saphenous vein ablation and stab phlebectomy greater than 20  HPI: Dakota Wolf is a 31 y.o. male with strong family history of varicose veins and himself having symptomatic left lower extremity varicose veins with a very large saphenous vein leading down to most of these veins on the medial leg and one crossing over the anterior leg to the lateral aspect.  He also has associated swelling consistent with C3 venous disease.  He has been compliant with thigh-high compression stockings.  Current Outpatient Medications  Medication Sig Dispense Refill   LORazepam (ATIVAN) 1 MG tablet Take 2 tablets 30 minutes prior to leaving house on day of office surgery.  Bring third tablet with you to office on day of office surgery. 3 tablet 0   valsartan (DIOVAN) 160 MG tablet Take 1 tablet (160 mg total) by mouth daily. 90 tablet 3   No current facility-administered medications for this visit.    PHYSICAL EXAM: Vitals:   07/14/22 0837  BP: (!) 131/90  Pulse: 76  Resp: 18  Temp: 97.7 F (36.5 C)  TempSrc: Temporal  SpO2: 100%  Weight: (!) 351 lb (159.2 kg)  Height:  (1.981 m)   Awake alert oriented Nonlabored respirations Varicose veins of left leg marked with the patient standing and great saphenous vein evaluated with the patient supine using ultrasound  PROCEDURE: Left greater saphenous vein laser ablation totaling 32 cm and stab phlebectomy greater than 20 of    TECHNIQUE: Mr. Boakye was brought to the exam room and was initially placed in the standing position where his varicosities were marked on the left lower extremity primarily on the left medial and posterior leg and one coursing across the anterior down to the lateral leg.  He was then laid supine on the procedural table and ultrasound was used to identify the very large great saphenous vein which was in  the fascia in the mid thigh and distally was greater than 1 cm deep and I could easily evaluate the saphenofemoral junction as well.  He was then sterilely prepped and draped in the left lower extremity circumferentially.  Ultrasound was used to identify the left greater saphenous vein and this was cannulated at the level of the knee using a micropuncture needle followed by a wire and a micropuncture sheath was placed.  A J-wire was then placed under fluoroscopic guidance to the saphenofemoral junction approximately 2.5 cm from the junction and a 65 cm sheath was then placed.  We again confirmed positioning with ultrasound.  Tumescent anesthesia was administered along the entire length of the planned treatment segment of the greater saphenous vein and was used to separate the saphenous vein from the skin distally in the thigh where it was more superficial.  The left greater saphenous vein was then ablated for a total of 32 cm.  After ablation the left common femoral vein was compressible.   Attention was then turned to the varicosities on the left lower extremity.  We performed greater than 20 stab phlebectomies of the left lower extremity focused on the medial left leg with a few on the posterior leg and the larger branch coursing anteriorly into the lateral leg.  This was done with 11 blade stab followed by grasping with crochet hook and hemostats and all veins were removed.  At completion Steri-Strips were placed and a compressive dressing was wrapped around  the left lower extremity.  He did tolerate this without immediate complication.     Plan will be for follow-up in 2 weeks with post ablation duplex.  Lemar Livings [facility] [phone]

## 2022-07-28 ENCOUNTER — Encounter: Payer: Self-pay | Admitting: Vascular Surgery

## 2022-07-28 ENCOUNTER — Ambulatory Visit (INDEPENDENT_AMBULATORY_CARE_PROVIDER_SITE_OTHER): Payer: Commercial Managed Care - HMO | Admitting: Vascular Surgery

## 2022-07-28 ENCOUNTER — Ambulatory Visit (HOSPITAL_COMMUNITY)
Admission: RE | Admit: 2022-07-28 | Discharge: 2022-07-28 | Disposition: A | Discharge status: Home / Self Care | Payer: Commercial Managed Care - HMO | Source: Ambulatory Visit | Attending: Vascular Surgery | Admitting: Vascular Surgery

## 2022-07-28 VITALS — BP 140/84 | HR 99 | Temp 97.6°F | Resp 16 | Wt 351.0 lb

## 2022-07-28 DIAGNOSIS — I83812 Varicose veins of left lower extremities with pain: Secondary | ICD-10-CM | POA: Diagnosis present

## 2022-07-28 NOTE — Progress Notes (Signed)
Subjective:     Patient ID: Dakota Wolf, male   DOB: [birth year], 32 y.o.   MRN: [id number]  HPI 32 year old male follows up after left greater saphenous vein ablation and stab phlebectomy totaling greater than 20.  He has had some pain mostly in the medial thigh but nothing is not tolerable he is back to walking for a 40 minutes a day now and ready to return to work.   Review of Systems As above    Objective:   Physical Exam Vitals:   07/28/22 1017  BP: (!) 140/84  Pulse: 99  Resp: 16  Temp: 97.6 F (36.4 C)  SpO2: 97%     Awake alert oriented Nonlabored respirations Left leg as pictured below  LEFT     Reflux NoRefluxReflux TimeDiameter cmsComments                     Yes                                   +---------+---------+------+-----------+------------+--------+  CFV                                           Patent    +---------+---------+------+-----------+------------+--------+  FV mid                                         Patent    +---------+---------+------+-----------+------------+--------+  Popliteal                                     Patent    +---------+---------+------+-----------+------------+--------+         Summary:  Left:  - No evidence of deep vein thrombosis from the common femoral through the  popliteal veins.  - Successful ablation from the proximal calf up to 1.48 cm from the SFJ.      LEFT     Reflux NoRefluxReflux TimeDiameter cmsComments                     Yes                                   +---------+---------+------+-----------+------------+--------+  CFV                                           Patent    +---------+---------+------+-----------+------------+--------+  FV mid                                         Patent    +---------+---------+------+-----------+------------+--------+  Popliteal                                     Patent     +---------+---------+------+-----------+------------+--------+    Summary:  Left:  - No evidence of  deep vein thrombosis from the common femoral through the  popliteal veins.  - Successful ablation from the proximal calf up to 1.48 cm from the SFJ.        Assessment:     32 year old male status post successful great saphenous vein ablation on the left without evidence of DVT and also he underwent greater than 20 stab phlebectomies now healing well.    Plan:     I have recommended continuing compression stockings while he has symptoms and also in the future to drain recurrence and he can follow-up with me on an as-needed basis.    Norissa Bartee C. Randie Heinz, MD [facility] Office: [phone] Pager: [phone]

## 2022-09-16 ENCOUNTER — Encounter: Payer: Self-pay | Admitting: Family Medicine

## 2022-11-01 ENCOUNTER — Telehealth: Payer: Commercial Managed Care - HMO | Admitting: Family Medicine

## 2022-11-01 DIAGNOSIS — G4733 Obstructive sleep apnea (adult) (pediatric): Secondary | ICD-10-CM

## 2022-11-01 MED ORDER — AMBULATORY NON FORMULARY MEDICATION: 0 refills | Status: AC

## 2022-11-01 MED ORDER — VALSARTAN 160 MG PO TABS: 160.0000 mg | ORAL_TABLET | Freq: Every day | ORAL | 3 refills | Status: AC

## 2022-11-01 NOTE — Progress Notes (Signed)
Attempted to contact the pt x 2.

## 2022-11-06 ENCOUNTER — Encounter: Payer: Self-pay | Admitting: Family Medicine

## 2022-11-06 DIAGNOSIS — G4733 Obstructive sleep apnea (adult) (pediatric): Secondary | ICD-10-CM | POA: Insufficient documentation

## 2022-11-06 NOTE — Progress Notes (Signed)
Dakota Wolf - 32 y.o. male MRN [id number]  Date of birth: 1990-08-18   This visit type was conducted due to national recommendations for restrictions regarding the COVID-19 Pandemic (e.g. social distancing).  This format is felt to be most appropriate for this patient at this time.  All issues noted in this document were discussed and addressed.  No physical exam was performed (except for noted visual exam findings with Video Visits).  I discussed the limitations of evaluation and management by telemedicine and the availability of in person appointments. The patient expressed understanding and agreed to proceed.  I connected withNAME@ on 11/01/22 at  4:10 PM EDT by a video enabled telemedicine application and verified that I am speaking with the correct person using two identifiers.  Present at visit: Dakota Coombe, DO Dakota Wolf   Patient Location: Home [address] [place] [address]   Provider location:   [facility]  No chief complaint on file.   HPI  Dakota Licausi is a 32 y.o. male who presents via Web designer for a telehealth visit today.  He is following up to review results of his sleep study.  His recent sleep study did show severe obstructive sleep apnea.  He does continue to have some fatigue like to proceed with CPAP therapy.    ROS:  A comprehensive ROS was completed and negative except as noted per HPI  Past Medical History:  Diagnosis Date   Hypertension     Past Surgical History:  Procedure Laterality Date   ENDOVENOUS ABLATION SAPHENOUS VEIN W/ LASER Left 07/14/2022   endovenous laser ablation left greater saphenous vein and stab phlebectomy >20 incisions left leg by Lemar Livings MD    Family History  Problem Relation Age of Onset   Hypertension Father    Other Maternal Uncle    Hypertension Maternal Grandmother    Other Cousin     Social History   Socioeconomic History   Marital status: Single    Spouse name: Not on file   Number  of children: 0   Years of education: Not on file   Highest education level: Bachelor's degree (e.g., BA, AB, BS)  Occupational History   Not on file  Tobacco Use   Smoking status: Light Smoker    Types: Cigars   Smokeless tobacco: Never   Tobacco comments:    07/02/20 occas cigar  Vaping Use   Vaping status: Some Days   Substances: THC  Substance and Sexual Activity   Alcohol use: Yes    Alcohol/week: 2.0 - 3.0 standard drinks of alcohol    Types: 2 - 3 Cans of beer per week   Drug use: No   Sexual activity: Yes    Partners: Female    Birth control/protection: Condom  Other Topics Concern   Not on file  Social History Narrative   Caffeine- 20 oz coffee, 2 sodas daily   Social Determinants of Health   Financial Resource Strain: Not on file  Food Insecurity: Not on file  Transportation Needs: Not on file  Physical Activity: Not on file  Stress: Not on file  Social Connections: Not on file  Intimate Partner Violence: Not on file     Current Outpatient Medications:    AMBULATORY NON FORMULARY MEDICATION, Please provide AutoPap with setting 4-20cmH20 Mask per patient preference.  Please provide headgear for mask dispensed, heated tubing, reservoir/humidifier, filters and any other accesories needed for machine.  Please send compliance data after 60 days., Disp: 1 Device,  Rfl: 0   LORazepam (ATIVAN) 1 MG tablet, Take 2 tablets 30 minutes prior to leaving house on day of office surgery.  Bring third tablet with you to office on day of office surgery., Disp: 3 tablet, Rfl: 0   valsartan (DIOVAN) 160 MG tablet, Take 1 tablet (160 mg total) by mouth daily., Disp: 90 tablet, Rfl: 3  EXAM:  VITALS per patient if applicable: There were no vitals taken for this visit.  GENERAL: alert, oriented, appears well and in no acute distress  HEENT: atraumatic, conjunttiva clear, no obvious abnormalities on inspection of external nose and ears  NECK: normal movements of the head and  neck  LUNGS: on inspection no signs of respiratory distress, breathing rate appears normal, no obvious gross SOB, gasping or wheezing  CV: no obvious cyanosis  MS: moves all visible extremities without noticeable abnormality  PSYCH/NEURO: pleasant and cooperative, no obvious depression or anxiety, speech and thought processing grossly intact  ASSESSMENT AND PLAN:  Discussed the following assessment and plan:  Obstructive sleep apnea I think he would benefit from starting CPAP therapy.  Order sent over for auto CPAP device.  We discussed that we will need to follow-up in about 60 days after he receives equipment for compliance monitoring as well as to be sure he is getting benefit from adding CPAP therapy.     I discussed the assessment and treatment plan with the patient. The patient was provided an opportunity to ask questions and all were answered. The patient agreed with the plan and demonstrated an understanding of the instructions.   The patient was advised to call back or seek an in-person evaluation if the symptoms worsen or if the condition fails to improve as anticipated.    Dakota Coombe, DO

## 2022-11-06 NOTE — Assessment & Plan Note (Signed)
I think he would benefit from starting CPAP therapy.  Order sent over for auto CPAP device.  We discussed that we will need to follow-up in about 60 days after he receives equipment for compliance monitoring as well as to be sure he is getting benefit from adding CPAP therapy.

## 2023-01-11 ENCOUNTER — Other Ambulatory Visit: Payer: Self-pay | Admitting: Family Medicine

## 2023-01-11 ENCOUNTER — Ambulatory Visit: Payer: Commercial Managed Care - HMO | Admitting: Family Medicine

## 2023-01-11 ENCOUNTER — Ambulatory Visit: Payer: Managed Care, Other (non HMO)

## 2023-01-11 ENCOUNTER — Encounter: Payer: Self-pay | Admitting: Family Medicine

## 2023-01-11 VITALS — BP 129/91 | HR 95 | Ht 78.0 in | Wt 371.0 lb

## 2023-01-11 DIAGNOSIS — M25572 Pain in left ankle and joints of left foot: Secondary | ICD-10-CM | POA: Diagnosis not present

## 2023-01-11 DIAGNOSIS — S92252A Displaced fracture of navicular [scaphoid] of left foot, initial encounter for closed fracture: Secondary | ICD-10-CM | POA: Insufficient documentation

## 2023-01-11 DIAGNOSIS — G8929 Other chronic pain: Secondary | ICD-10-CM | POA: Insufficient documentation

## 2023-01-11 MED ORDER — MELOXICAM 15 MG PO TABS
15.0000 mg | ORAL_TABLET | Freq: Every day | ORAL | 0 refills | Status: DC
Start: 2023-01-11 — End: 2023-03-01

## 2023-01-11 NOTE — Progress Notes (Signed)
Dakota Wolf - 32 y.o. male MRN [id number]  Date of birth: 03-Jan-1991  Subjective Chief Complaint  Patient presents with   Ankle Injury    HPI Dakota is a 32 year old male here today with complaint of left ankle pain.  Pain is located along the medial ankle.  He does have swelling of the ankle.  He did twist his ankle but did not think it was that bad.  He does not recall any other injuries.  He did meet with his athletic trainer who told him that he may have a fracture.  X-rays were completed prior to the appointment today.  He has not really tried anything so far for treatment.  ROS:  A comprehensive ROS was completed and negative except as noted per HPI  No Known Allergies  Past Medical History:  Diagnosis Date   Hypertension     Past Surgical History:  Procedure Laterality Date   ENDOVENOUS ABLATION SAPHENOUS VEIN W/ LASER Left 07/14/2022   endovenous laser ablation left greater saphenous vein and stab phlebectomy >20 incisions left leg by Lemar Livings MD    Social History   Socioeconomic History   Marital status: Single    Spouse name: Not on file   Number of children: 0   Years of education: Not on file   Highest education level: Bachelor's degree (e.g., BA, AB, BS)  Occupational History   Not on file  Tobacco Use   Smoking status: Light Smoker    Types: Cigars   Smokeless tobacco: Never   Tobacco comments:    07/02/20 occas cigar  Vaping Use   Vaping status: Some Days   Substances: THC  Substance and Sexual Activity   Alcohol use: Yes    Alcohol/week: 2.0 - 3.0 standard drinks of alcohol    Types: 2 - 3 Cans of beer per week   Drug use: No   Sexual activity: Yes    Partners: Female    Birth control/protection: Condom  Other Topics Concern   Not on file  Social History Narrative   Caffeine- 20 oz coffee, 2 sodas daily   Social Determinants of Health   Financial Resource Strain: Not on file  Food Insecurity: Not on file  Transportation Needs: Not on  file  Physical Activity: Not on file  Stress: Not on file  Social Connections: Not on file    Family History  Problem Relation Age of Onset   Hypertension Father    Other Maternal Uncle    Hypertension Maternal Grandmother    Other Cousin     Health Maintenance  Topic Date Due   Hepatitis C Screening  Never done   COVID-19 Vaccine (1 - 2023-24 season) Never done   INFLUENZA VACCINE  06/26/2023 (Originally 10/27/2022)   DTaP/Tdap/Td (2 - Td or Tdap) 11/27/2028   HIV Screening  Completed   HPV VACCINES  Aged Out     ----------------------------------------------------------------------------------------------------------------------------------------------------------------------------------------------------------------- Physical Exam BP (!) 129/91 (BP Location: Right Arm, Patient Position: Sitting, Cuff Size: Large)   Pulse 95   Ht 6\' 6"  (1.981 m)   Wt (!) 371 lb (168.3 kg)   SpO2 99%   BMI 42.87 kg/m   Physical Exam Constitutional:      Appearance: Normal appearance.  Musculoskeletal:     Comments: He does have soft tissue swelling along the medial ankle tenderness around the soft tissue adjacent to the medial malleolus as well.  No significant tenderness along the malleolus.  Range of motion is fairly good.  He  is able to bear weight.  Neurological:     Mental Status: He is alert.     ------------------------------------------------------------------------------------------------------------------------------------------------------------------------------------------------------------------- Assessment and Plan  Acute left ankle pain I personally reviewed his x-rays as radiology read was not back yet.  He does have soft tissue swelling however I do not see any evidence of acute fracture.  He does have some spurring along the calcaneus.  I did prescribe a walking boot and meloxicam for him today.   Meds ordered this encounter  Medications   meloxicam (MOBIC) 15  MG tablet    Sig: Take 1 tablet (15 mg total) by mouth daily.    Dispense:  30 tablet    Refill:  0    Return in about 6 weeks (around 02/22/2023) for f/u HTN/Testosterone.    This visit occurred during the SARS-CoV-2 public health emergency.  Safety protocols were in place, including screening questions prior to the visit, additional usage of staff PPE, and extensive cleaning of exam room while observing appropriate contact time as indicated for disinfecting solutions.

## 2023-01-11 NOTE — Assessment & Plan Note (Signed)
I personally reviewed his x-rays as radiology read was not back yet.  He does have soft tissue swelling however I do not see any evidence of acute fracture.  He does have some spurring along the calcaneus.  I did prescribe a walking boot and meloxicam for him today.

## 2023-01-19 ENCOUNTER — Ambulatory Visit (INDEPENDENT_AMBULATORY_CARE_PROVIDER_SITE_OTHER): Payer: Managed Care, Other (non HMO) | Admitting: Sports Medicine

## 2023-01-19 ENCOUNTER — Encounter: Payer: Self-pay | Admitting: Sports Medicine

## 2023-01-19 DIAGNOSIS — G8929 Other chronic pain: Secondary | ICD-10-CM | POA: Diagnosis not present

## 2023-01-19 DIAGNOSIS — M25572 Pain in left ankle and joints of left foot: Secondary | ICD-10-CM

## 2023-01-19 NOTE — Progress Notes (Signed)
    Procedures performed today:    None.  Independent interpretation of notes and tests performed by another provider:   None.  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 X-ray was done which was negative for fracture. He was prescribed a boot, he typically wears it but did not wear it today because he had to work on a roof. He has not yet started the meloxicam. On exam he has visible swelling, he has tenderness at the medial malleolus as well as just posterior to the medial malleolus, otherwise good motion, strength is limited by pain to resisted inversion and eversion. At this point the diagnosis is still a sprain, he will wear his boot as consistently as possible and start the meloxicam. Due to his multiple injuries over the years we will also add some formal physical therapy. I would like to see him back in approximately 3 weeks and if doing well we will get rid of the boot. If still having pain we will consider MRI.    ____________________________________________ Ihor Austin. Benjamin Stain, M.D., ABFM., CAQSM., AME. Primary Care and Sports Medicine  [facility]  Adjunct Professor of Family Medicine  [place] [employer]  Restaurant manager, fast food

## 2023-01-19 NOTE — Assessment & Plan Note (Signed)
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 X-ray was done which was negative for fracture. He was prescribed a boot, he typically wears it but did not wear it today because he had to work on a roof. He has not yet started the meloxicam. On exam he has visible swelling, he has tenderness at the medial malleolus as well as just posterior to the medial malleolus, otherwise good motion, strength is limited by pain to resisted inversion and eversion. At this point the diagnosis is still a sprain, he will wear his boot as consistently as possible and start the meloxicam. Due to his multiple injuries over the years we will also add some formal physical therapy. I would like to see him back in approximately 3 weeks and if doing well we will get rid of the boot. If still having pain we will consider MRI.

## 2023-02-01 ENCOUNTER — Telehealth: Payer: Self-pay | Admitting: Sports Medicine

## 2023-02-01 NOTE — Telephone Encounter (Signed)
Yes,, I had sent in meloxicam, meloxicam is an anti-inflammatory i.e. a pain medication.

## 2023-02-01 NOTE — Telephone Encounter (Signed)
Patient is requesting something to help with the pain, not the swelling, in his left ankle. Please advise.

## 2023-02-06 NOTE — Therapy (Signed)
OUTPATIENT PHYSICAL THERAPY LOWER EXTREMITY EVALUATION   Patient Name: Dakota Wolf MRN: [id number] DOB:1990-12-18, 32 y.o., male Today's Date: 02/07/2023  END OF SESSION:  PT End of Session - 02/07/23 0809     Visit Number 1    Date for PT Re-Evaluation 05/02/23    Authorization Type [employer]    Authorization Time Period pending    Progress Note Due on Visit 10    PT Start Time 0806    PT Stop Time 0840    PT Time Calculation (min) 34 min    Activity Tolerance Patient tolerated treatment well    Behavior During Therapy [facility] for tasks assessed/performed             Past Medical History:  Diagnosis Date   Hypertension    Past Surgical History:  Procedure Laterality Date   ENDOVENOUS ABLATION SAPHENOUS VEIN W/ LASER Left 07/14/2022   endovenous laser ablation left greater saphenous vein and stab phlebectomy >20 incisions left leg by Lemar Livings MD   Patient Active Problem List   Diagnosis Date Noted   Chronic pain of left ankle 01/11/2023   Obstructive sleep apnea 11/06/2022   Hypogonadism in male 07/10/2022   Well adult exam 06/07/2022   Screening for HIV (human immunodeficiency virus) 09/06/2021   Varicose veins of left lower extremity with pain 09/06/2021   Lumbar radiculitis 05/27/2021   Essential hypertension 05/26/2020   Daytime somnolence 05/26/2020   Possible exposure to STD 05/26/2020    PCP: Everrett Coombe, DO   REFERRING PROVIDER: Monica Becton  REFERRING DIAG: [id number] (ICD-10-CM) - Chronic pain of left ankle   THERAPY DIAG:  Pain in left ankle and joints of left foot - Plan: PT plan of care cert/re-cert  Stiffness of left ankle, not elsewhere classified - Plan: PT plan of care cert/re-cert  Localized edema - Plan: PT plan of care cert/re-cert  Rationale for Evaluation and Treatment: Rehabilitation  ONSET DATE: October 2024  SUBJECTIVE:   SUBJECTIVE STATEMENT: Can't pinpoint what caused it to start hurting.   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 Karie Schwalbe.    PERTINENT HISTORY: HTN, smoker PAIN:  Are you having pain? Yes: NPRS scale: 6-7/10 Pain location: L ankle Pain description: sharp Aggravating factors: stairs, moving it, walking without boot Relieving factors: wearing boot, ice, elevation, rest, meloxicam  PRECAUTIONS: None  RED FLAGS: None   WEIGHT BEARING RESTRICTIONS: No  FALLS:  Has patient fallen in last 6 months? Yes. 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 Benjamin Stain  OBJECTIVE:  Note: Objective measures were completed at Evaluation unless otherwise noted.  DIAGNOSTIC FINDINGS: 01/11/23 DG Ankle IMPRESSION: 1. No acute fracture or dislocation. 2. Mild-to-moderate diffuse soft tissue swelling of the visualized calf and ankle.  PATIENT SURVEYS:  LEFS 39/80  COGNITION: Overall cognitive status: Within functional limits for tasks assessed     SENSATION: WFL  EDEMA:  Circumferential: R 64 cm,Left 68 cm  POSTURE: No Significant postural limitations  PALPATION: Tenderness over Left  Anterior talofibular ligament, calcaneofibular ligament, and posterior talofibular ligament.  CFL most tender.    LOWER EXTREMITY MMT:   MMT Right eval Left * eval  Ankle dorsiflexion 5   Ankle plantarflexion 5   Ankle inversion 5 2  Ankle eversion 5 2   (Blank rows = not tested) * based on active movement, no resisted movement due to possible new  injury  LOWER EXTREMITY ROM:  PROM Right eval Left eval  Ankle dorsiflexion 10 -15*  Ankle plantarflexion    Ankle inversion 40 15  Ankle eversion 35 10   (Blank rows = not tested)  LOWER EXTREMITY SPECIAL TESTS:  Ankle special tests: Anterior drawer test: negative  GAIT: Distance walked: 39' Assistive device utilized: None Level of assistance: Complete Independence Comments: in CAM boot on L, antalgic   TODAY'S TREATMENT:                                                                                                                              DATE:   02/07/23 EVAL     PATIENT EDUCATION:  Education details: findings, POC.  Gentle pain free ankle pumps given for HEP Person educated: Patient Education method: Medical illustrator Education comprehension: verbalized understanding and returned demonstration  HOME EXERCISE PROGRAM: TBD  ASSESSMENT:  CLINICAL IMPRESSION: Patient is a 32 y.o. male who was seen today for physical therapy evaluation and treatment for chronic L ankle pain.   He had an actute on chronic ankle injury last week, felt a pop, and has been having more ankle swelling and pain since then, wearing CAM boot today.  Noted increased edema, tenderness over L lateral ligaments (ATFL, CLF, and PTFL), suggesting new ankle sprain.  He does have follow up with Dr. Benjamin Stain on Wednesday who can further assess ankle. Recommended continuing to wear boot, but gentle ankle pumps in pain free ROM, to gently mobilize and promote blood flow.  Dakota Hardrick demonstrates L ankle pain, decreased strength, ROM, and proprioception and would benefit from skilled physical therapy to improve ankle strength and mobility to prevent further injury.    OBJECTIVE IMPAIRMENTS: Abnormal gait, decreased activity tolerance, decreased balance, decreased endurance, decreased mobility, difficulty walking, decreased ROM, decreased strength, increased edema, increased muscle  spasms, and pain.   ACTIVITY LIMITATIONS: standing, squatting, stairs, and locomotion level  PARTICIPATION LIMITATIONS: meal prep, cleaning, laundry, shopping, community activity, occupation, and yard work  PERSONAL FACTORS: Past/current experiences, Time since onset of injury/illness/exacerbation, and 1-2 comorbidities: previous ankle sprain, smoker  are also affecting patient's functional outcome.   REHAB POTENTIAL: Good  CLINICAL DECISION MAKING: Evolving/moderate complexity  EVALUATION COMPLEXITY: Low   GOALS: Goals reviewed with patient? Yes  SHORT TERM GOALS: Target date: 03/07/2023   Patient will be independent with initial HEP. Baseline: needs  Goal status: INITIAL  LONG TERM GOALS: Target date: 05/02/2023   Patient will be independent with advanced/ongoing  HEP to improve outcomes and carryover.  Baseline:  Goal status: INITIAL  2.  Patient will report at least 75% improvement in Left ankle/foot pain to improve QOL. Baseline:  Goal status: INITIAL  3.  Patient will demonstrate improved left ankle AROM to WNL to allow for normal gait and stair mechanics. Baseline: see objective Goal status: INITIAL  4.  Patient will demonstrate improved functional LE strength as demonstrated by 5/5 L ankle strength. Baseline: see objective Goal status: INITIAL  5.  Patient will be able to ascend/descend stairs with reciprocal step pattern safely to access home and community.  Baseline: step to Goal status: INITIAL  6.  Patient will report at least 9 points improvement on LEFS to demonstrate improved functional ability. Baseline: 39/80 Goal status: INITIAL  PLAN:  PT FREQUENCY: 1-2x/week  PT DURATION: 12 weeks (extended due to holiday, patient going out of town, and healing time for recent injury)  PLANNED INTERVENTIONS: 97110-Therapeutic exercises, 97530- Therapeutic activity, [id number]- Neuromuscular re-education, 97535- Self Care, 10272- Manual therapy, [id number]- Gait training,  [id number]- Orthotic Fit/training, [id number]- Splinting, 97014- Electrical stimulation (unattended), [id number]- Electrical stimulation (manual), 97016- Vasopneumatic device, [id number]- Ionotophoresis 4mg /ml Dexamethasone, Patient/Family education, Balance training, Stair training, Taping, Dry Needling, Joint mobilization, Joint manipulation, Cryotherapy, and Moist heat  PLAN FOR NEXT SESSION: progress HEP as tolerated, Game ready, start NWB, proprioception ankle exercises.    Jena Gauss, PT 02/07/2023, 6:40 PM

## 2023-02-07 ENCOUNTER — Encounter: Payer: Self-pay | Admitting: Physical Therapy

## 2023-02-07 ENCOUNTER — Ambulatory Visit: Payer: Commercial Managed Care - HMO | Attending: Sports Medicine | Admitting: Physical Therapy

## 2023-02-07 ENCOUNTER — Other Ambulatory Visit: Payer: Self-pay

## 2023-02-07 DIAGNOSIS — G8929 Other chronic pain: Secondary | ICD-10-CM | POA: Insufficient documentation

## 2023-02-07 DIAGNOSIS — M25572 Pain in left ankle and joints of left foot: Secondary | ICD-10-CM | POA: Diagnosis present

## 2023-02-07 DIAGNOSIS — R600 Localized edema: Secondary | ICD-10-CM | POA: Diagnosis present

## 2023-02-07 DIAGNOSIS — M25672 Stiffness of left ankle, not elsewhere classified: Secondary | ICD-10-CM | POA: Diagnosis present

## 2023-02-09 ENCOUNTER — Ambulatory Visit (INDEPENDENT_AMBULATORY_CARE_PROVIDER_SITE_OTHER): Payer: Managed Care, Other (non HMO) | Admitting: Sports Medicine

## 2023-02-09 ENCOUNTER — Encounter: Payer: Self-pay | Admitting: Sports Medicine

## 2023-02-09 DIAGNOSIS — G8929 Other chronic pain: Secondary | ICD-10-CM | POA: Diagnosis not present

## 2023-02-09 DIAGNOSIS — M25572 Pain in left ankle and joints of left foot: Secondary | ICD-10-CM | POA: Diagnosis not present

## 2023-02-09 NOTE — Assessment & Plan Note (Signed)
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 Meloxicam is somewhat helpful. Pain at the time was medial malleolus and posterior to the medial malleolus. Otherwise good motion, strength was limited by pain to resisted inversion and eversion. We obtained an x-ray, this was negative, we added physical therapy, he is done a few sessions. He did come out of the boot early to play softball, unfortunately he rolled the ankle again and has some pain laterally. Ankle is still swollen, he has reproduction of pain with the anterior drawer test, at this point due to failure of conservative treatment and many months of pain we will proceed with MRI. He will continue PT, he does want another boot. He is going to [place] in about 3 weeks so we will see him back just prior, and if the MRI findings are amenable to injections and I am happy to do it before his trip. He does plan to do some skiing, skiing is okay as he will be in a fairly sturdy boot.

## 2023-02-09 NOTE — Progress Notes (Signed)
    Procedures performed today:    None.  Independent interpretation of notes and tests performed by another provider:   None.  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 Meloxicam is somewhat helpful. Pain at the time was medial malleolus and posterior to the medial malleolus. Otherwise good motion, strength was limited by pain to resisted inversion and eversion. We obtained an x-ray, this was negative, we added physical therapy, he is done a few sessions. He did come out of the boot early to play softball, unfortunately he rolled the ankle again and has some pain laterally. Ankle is still swollen, he has reproduction of pain with the anterior drawer test, at this point due to failure of conservative treatment and many months of pain we will proceed with MRI. He will continue PT, he does want another boot. He is going to [place] in about 3 weeks so we will see him back just prior, and if the MRI findings are amenable to injections and I am happy to do it before his trip. He does plan to do some skiing, skiing is okay as he will be in a fairly sturdy boot.    ____________________________________________ Dakota Wolf. Dakota Wolf, M.D., ABFM., CAQSM., AME. Primary Care and Sports Medicine [facility]  Adjunct Professor of Family Medicine  [place] [employer]  Restaurant manager, fast food

## 2023-02-20 ENCOUNTER — Telehealth: Payer: Self-pay

## 2023-02-20 NOTE — Telephone Encounter (Signed)
Insurance denied the auth for MRI left ankle study. Per insurance submitted clinical notes does not demonstrate the medical necessity.  Patient has not completed 6 weeks of provider directed treatment. The provider directed treatment did not occur within the last three months.  Symptoms must be the same or worse after treatment to support imaging. There was no contact with your provider after completing treatment.  Provider can appeal at [phone]. Case/Ref # [id number].

## 2023-02-20 NOTE — Telephone Encounter (Signed)
First available peer to peer appointment scheduled for December 3 at noon.  Please let the patient know this is the soonest that his insurance company would be willing to talk to me to try to get the MRI approved.

## 2023-02-21 NOTE — Telephone Encounter (Signed)
Task completed. Patient has been updated. Verbalized understanding. Per patient, he has an upcoming appt on 03/01/23.

## 2023-02-27 ENCOUNTER — Ambulatory Visit: Payer: Commercial Managed Care - HMO | Admitting: Physical Therapy

## 2023-03-01 ENCOUNTER — Ambulatory Visit (INDEPENDENT_AMBULATORY_CARE_PROVIDER_SITE_OTHER): Payer: Commercial Managed Care - HMO | Admitting: Sports Medicine

## 2023-03-01 ENCOUNTER — Encounter: Payer: Self-pay | Admitting: Sports Medicine

## 2023-03-01 DIAGNOSIS — M25572 Pain in left ankle and joints of left foot: Secondary | ICD-10-CM

## 2023-03-01 DIAGNOSIS — S92255D Nondisplaced fracture of navicular [scaphoid] of left foot, subsequent encounter for fracture with routine healing: Secondary | ICD-10-CM

## 2023-03-01 DIAGNOSIS — G8929 Other chronic pain: Secondary | ICD-10-CM | POA: Diagnosis not present

## 2023-03-01 MED ORDER — MELOXICAM 15 MG PO TABS: 15.0000 mg | ORAL_TABLET | Freq: Every day | ORAL | 0 refills | Status: AC

## 2023-03-01 MED ORDER — TRAMADOL HCL 50 MG PO TABS: 50.0000 mg | ORAL_TABLET | Freq: Three times a day (TID) | ORAL | 0 refills | Status: AC | PRN

## 2023-03-01 NOTE — Progress Notes (Addendum)
    Procedures performed today:    None.  Independent interpretation of notes and tests performed by another provider:   None.  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 He did not fortunately rolled his ankle again. He continued to have lateral ankle pain, medial ankle pain, positive anterior drawer test, we ordered an MRI, this was initially denied and is now approved. It just needs to be scheduled. Unfortunately he is leaving the country on Friday, we will try to get the MRI done somewhere within the [facility] health system Wednesday or Thursday. Continue meloxicam , adding tramadol  for breakthrough pain. He is going to be doing some skiing which I think is okay because he will be in a fairly sturdy ski boot. Return to see me after the MRI, potentially after his trip for further advice.  Update: MRI shows multiple pathologic findings including a acute fracture through the navicular bone, bone contusions in the cuneiforms, cuboid, talus, partial tearing of the joint capsule and some sprains in the midfoot tendons. This will heal but immobilization is the key.    ____________________________________________ Debby PARAS. Curtis, M.D., ABFM., CAQSM., AME. Primary Care and Sports Medicine [facility]  Adjunct Professor of [facility] Medicine  [employer]  Restaurant Manager, [employer]

## 2023-03-01 NOTE — Assessment & Plan Note (Signed)
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 He did not fortunately rolled his ankle again. He continued to have lateral ankle pain, medial ankle pain, positive anterior drawer test, we ordered an MRI, this was initially denied and is now approved. It just needs to be scheduled. Unfortunately he is leaving the country on Friday, we will try to get the MRI done somewhere within the [facility] health system Wednesday or Thursday. Continue meloxicam, adding tramadol for breakthrough pain. He is going to be doing some skiing which I think is okay because he will be in a fairly sturdy ski boot. Return to see me after the MRI, potentially after his trip for further advice.  Update: MRI shows multiple pathologic findings including a acute fracture through the navicular bone, bone contusions in the cuneiforms, cuboid, talus, partial tearing of the joint capsule and some sprains in the midfoot tendons. This will heal but immobilization is the key.

## 2023-03-02 ENCOUNTER — Ambulatory Visit (INDEPENDENT_AMBULATORY_CARE_PROVIDER_SITE_OTHER): Payer: Commercial Managed Care - HMO | Admitting: Family Medicine

## 2023-03-02 ENCOUNTER — Ambulatory Visit: Payer: Managed Care, Other (non HMO) | Admitting: Sports Medicine

## 2023-03-02 ENCOUNTER — Ambulatory Visit: Payer: Commercial Managed Care - HMO | Attending: Sports Medicine

## 2023-03-02 ENCOUNTER — Encounter: Payer: Self-pay | Admitting: Sports Medicine

## 2023-03-02 ENCOUNTER — Encounter: Payer: Self-pay | Admitting: Family Medicine

## 2023-03-02 VITALS — BP 136/87 | HR 76 | Ht 78.0 in | Wt 371.0 lb

## 2023-03-02 DIAGNOSIS — M25672 Stiffness of left ankle, not elsewhere classified: Secondary | ICD-10-CM | POA: Insufficient documentation

## 2023-03-02 DIAGNOSIS — G8929 Other chronic pain: Secondary | ICD-10-CM | POA: Insufficient documentation

## 2023-03-02 DIAGNOSIS — G4733 Obstructive sleep apnea (adult) (pediatric): Secondary | ICD-10-CM | POA: Diagnosis not present

## 2023-03-02 DIAGNOSIS — I10 Essential (primary) hypertension: Secondary | ICD-10-CM

## 2023-03-02 DIAGNOSIS — M25572 Pain in left ankle and joints of left foot: Secondary | ICD-10-CM | POA: Diagnosis present

## 2023-03-02 DIAGNOSIS — R600 Localized edema: Secondary | ICD-10-CM | POA: Insufficient documentation

## 2023-03-02 NOTE — Assessment & Plan Note (Signed)
Blood pressure is at goal at for age and co-morbidities.  I recommend continuation of diovan at current strength.  In addition they were instructed to follow a low sodium diet with regular exercise to help to maintain adequate control of blood pressure.

## 2023-03-02 NOTE — Assessment & Plan Note (Signed)
Continue diet and lifestyle changes.  Encouraged to check with insurance re: GLP-1 coverage.

## 2023-03-02 NOTE — Patient Instructions (Signed)
Continue valsartan at current strength.

## 2023-03-02 NOTE — Therapy (Signed)
OUTPATIENT PHYSICAL THERAPY LOWER EXTREMITY TREATMENT   Patient Name: Dakota Wolf MRN: [id number] DOB:04/02/1990, 32 y.o., male Today's Date: 03/02/2023  END OF SESSION:  PT End of Session - 03/02/23 1712     Visit Number 2    Date for PT Re-Evaluation 05/02/23    Authorization Type [employer]    Authorization Time Period pending    Progress Note Due on Visit 10    PT Start Time 1620    PT Stop Time 1700    PT Time Calculation (min) 40 min    Activity Tolerance Patient tolerated treatment well    Behavior During Therapy [facility] for tasks assessed/performed              Past Medical History:  Diagnosis Date   Hypertension    Past Surgical History:  Procedure Laterality Date   ENDOVENOUS ABLATION SAPHENOUS VEIN W/ LASER Left 07/14/2022   endovenous laser ablation left greater saphenous vein and stab phlebectomy >20 incisions left leg by Lemar Livings MD   Patient Active Problem List   Diagnosis Date Noted   Morbid obesity (HCC) 03/02/2023   Fracture of navicular bone of left foot 01/11/2023   Obstructive sleep apnea 11/06/2022   Hypogonadism in male 07/10/2022   Well adult exam 06/07/2022   Screening for HIV (human immunodeficiency virus) 09/06/2021   Varicose veins of left lower extremity with pain 09/06/2021   Lumbar radiculitis 05/27/2021   Essential hypertension 05/26/2020   Daytime somnolence 05/26/2020   Possible exposure to STD 05/26/2020    PCP: Everrett Coombe, DO   REFERRING PROVIDER: Monica Becton,*  REFERRING DIAG: [phone] (ICD-10-CM) - Chronic pain of left ankle   THERAPY DIAG:  Pain in left ankle and joints of left foot  Stiffness of left ankle, not elsewhere classified  Localized edema  Rationale for Evaluation and Treatment: Rehabilitation  ONSET DATE: October 2024  SUBJECTIVE:   SUBJECTIVE STATEMENT: Pt reports he received MRI today  PERTINENT HISTORY: HTN, smoker PAIN:  Are you having pain? Yes: NPRS  scale: 6-7/10 Pain location: L ankle Pain description: sharp Aggravating factors: stairs, moving it, walking without boot Relieving factors: wearing boot, ice, elevation, rest, meloxicam  PRECAUTIONS: None  RED FLAGS: None   WEIGHT BEARING RESTRICTIONS: No  FALLS:  Has patient fallen in last 6 months? Yes. 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 Benjamin Stain  OBJECTIVE:  Note: Objective measures were completed at Evaluation unless otherwise noted.  DIAGNOSTIC FINDINGS: 01/11/23 DG Ankle IMPRESSION: 1. No acute fracture or dislocation. 2. Mild-to-moderate diffuse soft tissue swelling of the visualized calf and ankle.  PATIENT SURVEYS:  LEFS 39/80  COGNITION: Overall cognitive status: Within functional limits for tasks assessed     SENSATION: WFL  EDEMA:  Circumferential: R 64 cm,Left 68 cm   POSTURE: No Significant postural limitations  PALPATION: Tenderness over Left  Anterior talofibular ligament, calcaneofibular ligament, and posterior talofibular ligament.  CFL most tender.    LOWER EXTREMITY MMT:   MMT Right eval Left * eval  Ankle dorsiflexion 5   Ankle plantarflexion 5   Ankle inversion 5 2  Ankle eversion 5 2   (Blank rows = not tested) * based on active movement, no resisted movement  due to possible new injury  LOWER EXTREMITY ROM:  PROM Right eval Left eval  Ankle dorsiflexion 10 -15*  Ankle plantarflexion    Ankle inversion 40 15  Ankle eversion 35 10   (Blank rows = not tested)  LOWER EXTREMITY SPECIAL TESTS:  Ankle special tests: Anterior drawer test: negative  GAIT: Distance walked: 28' Assistive device utilized: None Level of assistance:  Complete Independence Comments: in CAM boot on L, antalgic   TODAY'S TREATMENT:                                                                                                                              DATE:  03/02/23 STM to L gastroc PROM to L ankle   Manual edema resorption to L ankle and lower leg Seated L gastroc stretch with strap 2x30" Seated toe curls x20  Seated toe spreads x 20 Seated L toe raises x 10  02/07/23 EVAL     PATIENT EDUCATION:  Education details: HEP, education on pain-free AROM Person educated: Patient Education method: Medical illustrator Education comprehension: verbalized understanding and returned demonstration  HOME EXERCISE PROGRAM: Access Code: [email or web address] URL: [email or web address] Date: 03/02/2023 Prepared by: Verta Ellen  Exercises - Long Sitting Calf Stretch with Strap  - 1 x daily - 7 x weekly - 3 sets - 10 reps - Seated Toe Raise  - 1 x daily - 7 x weekly - 3 sets - 10 reps - Towel Scrunches  - 1 x daily - 7 x weekly - 3 sets - 10 reps - Toe Spreading  - 1 x daily - 7 x weekly - 3 sets - 10 reps  ** Instruction on pain-free AROM for L ankle  ASSESSMENT:  CLINICAL IMPRESSION: Pt reports that he received MRI results today,  MRI shows full-thickness, partial width tear of the dorsal capsule of the talonavicular joint, mild tearing of L achilles tendon and fracture of the navicular. Continued with manual techniques to reduce soft tissue restrictions along with reducing edema in lower leg. Pt tolerated the exercises well but with cues required to avoid painful ROM. Instructed on HEP to complete as well. Pt continues ambulating in CAM boot, needs to schedule another f/u with Dr. Karie Schwalbe for check up. Dakota Hally demonstrates L ankle pain, decreased strength, ROM, and proprioception and would benefit from skilled physical therapy to improve ankle strength and mobility to prevent further injury.    OBJECTIVE IMPAIRMENTS:  Abnormal gait, decreased activity tolerance, decreased balance, decreased endurance, decreased mobility, difficulty walking, decreased ROM, decreased strength, increased edema, increased muscle spasms, and pain.   ACTIVITY LIMITATIONS: standing, squatting, stairs, and locomotion level  PARTICIPATION LIMITATIONS: meal prep, cleaning, laundry, shopping, community activity, occupation, and yard work  PERSONAL FACTORS: Past/current experiences, Time since onset of injury/illness/exacerbation, and 1-2 comorbidities: previous ankle sprain, smoker  are also affecting patient's functional outcome.   REHAB POTENTIAL: Good  CLINICAL DECISION MAKING: Evolving/moderate complexity  EVALUATION COMPLEXITY: Low  GOALS: Goals reviewed with patient? Yes  SHORT TERM GOALS: Target date: 03/07/2023   Patient will be independent with initial HEP. Baseline: needs  Goal status: IN PROGRESS  LONG TERM GOALS: Target date: 05/02/2023   Patient will be independent with advanced/ongoing HEP to improve outcomes and carryover.  Baseline:  Goal status: IN PROGRESS  2.  Patient will report at least 75% improvement in Left ankle/foot pain to improve QOL. Baseline:  Goal status: IN PROGRESS  3.  Patient will demonstrate improved left ankle AROM to WNL to allow for normal gait and stair mechanics. Baseline: see objective Goal status: IN PROGRESS  4.  Patient will demonstrate improved functional LE strength as demonstrated by 5/5 L ankle strength. Baseline: see objective Goal status: IN PROGRESS  5.  Patient will be able to ascend/descend stairs with reciprocal step pattern safely to access home and community.  Baseline: step to Goal status: IN PROGRESS  6.  Patient will report at least 9 points improvement on LEFS to demonstrate improved functional ability. Baseline: 39/80 Goal status: IN PROGRESS  PLAN:  PT FREQUENCY: 1-2x/week  PT DURATION: 12 weeks (extended due to holiday, patient going out of  town, and healing time for recent injury)  PLANNED INTERVENTIONS: 97110-Therapeutic exercises, 97530- Therapeutic activity, [id number]- Neuromuscular re-education, 97535- Self Care, 95284- Manual therapy, [id number]- Gait training, [id number]- Orthotic Fit/training, [id number]- Splinting, 97014- Electrical stimulation (unattended), [id number]- Electrical stimulation (manual), 97016- Vasopneumatic device, [id number]- Ionotophoresis 4mg /ml Dexamethasone, Patient/Family education, Balance training, Stair training, Taping, Dry Needling, Joint mobilization, Joint manipulation, Cryotherapy, and Moist heat  PLAN FOR NEXT SESSION: progress HEP as tolerated, Game ready, start NWB, proprioception ankle exercises.    Darleene Cleaver, PTA 03/02/2023, 5:13 PM

## 2023-03-02 NOTE — Assessment & Plan Note (Signed)
Continue use of CPAP. 

## 2023-03-02 NOTE — Progress Notes (Signed)
Dakota Wolf - 32 y.o. male MRN [id number]  Date of birth: 1990-11-30  Subjective Chief Complaint  Patient presents with   Hypertension    HPI Dakota Wolf is a 32 y.o. male here today for follow up .   He remains on valsartan for management of HTN.  Overall he is doing well with this.  BP mildly elevated on initial check. He denies side effects at this time. He has not had chest pain, shortness of breath, palpitations, headache or vision changes.    He is interested in GLP-1 to help with weight management.  He does stay active and exercises frequently.    He has been seeing Dr. Benjamin Stain for continued ankle pain.  He has upcoming MRI of the ankle.  Remains on meloxicam.   ROS:  A comprehensive ROS was completed and negative except as noted per HPI  No Known Allergies  Past Medical History:  Diagnosis Date   Hypertension     Past Surgical History:  Procedure Laterality Date   ENDOVENOUS ABLATION SAPHENOUS VEIN W/ LASER Left 07/14/2022   endovenous laser ablation left greater saphenous vein and stab phlebectomy >20 incisions left leg by Lemar Livings MD    Social History   Socioeconomic History   Marital status: Single    Spouse name: Not on file   Number of children: 0   Years of education: Not on file   Highest education level: Bachelor's degree (e.g., BA, AB, BS)  Occupational History   Not on file  Tobacco Use   Smoking status: Light Smoker    Types: Cigars   Smokeless tobacco: Never   Tobacco comments:    07/02/20 occas cigar  Vaping Use   Vaping status: Some Days   Substances: THC  Substance and Sexual Activity   Alcohol use: Yes    Alcohol/week: 2.0 - 3.0 standard drinks of alcohol    Types: 2 - 3 Cans of beer per week   Drug use: No   Sexual activity: Yes    Partners: Female    Birth control/protection: Condom  Other Topics Concern   Not on file  Social History Narrative   Caffeine- 20 oz coffee, 2 sodas daily   Social Determinants of Health    Financial Resource Strain: Not on file  Food Insecurity: Not on file  Transportation Needs: Not on file  Physical Activity: Not on file  Stress: Not on file  Social Connections: Not on file    Family History  Problem Relation Age of Onset   Hypertension Father    Other Maternal Uncle    Hypertension Maternal Grandmother    Other Cousin     Health Maintenance  Topic Date Due   Hepatitis C Screening  Never done   COVID-19 Vaccine (1 - 2023-24 season) Never done   INFLUENZA VACCINE  06/26/2023 (Originally 10/27/2022)   DTaP/Tdap/Td (2 - Td or Tdap) 11/27/2028   HIV Screening  Completed   HPV VACCINES  Aged Out     ----------------------------------------------------------------------------------------------------------------------------------------------------------------------------------------------------------------- Physical Exam BP (!) 143/91 (BP Location: Left Arm, Patient Position: Sitting, Cuff Size: Large)   Pulse 76   Ht 6\' 6"  (1.981 m)   Wt (!) 371 lb (168.3 kg)   SpO2 97%   BMI 42.87 kg/m   Physical Exam Constitutional:      Appearance: Normal appearance.  Cardiovascular:     Rate and Rhythm: Normal rate and regular rhythm.  Pulmonary:     Effort: Pulmonary effort is normal.  Breath sounds: Normal breath sounds.  Neurological:     Mental Status: He is alert.  Psychiatric:        Mood and Affect: Mood normal.        Behavior: Behavior normal.     ------------------------------------------------------------------------------------------------------------------------------------------------------------------------------------------------------------------- Assessment and Plan  Obstructive sleep apnea Continue use of CPAP  Essential hypertension Blood pressure is at goal at for age and co-morbidities.  I recommend continuation of diovan at current strength.  In addition they were instructed to follow a low sodium diet with regular exercise to help  to maintain adequate control of blood pressure.    Morbid obesity (HCC) Continue diet and lifestyle changes.  Encouraged to check with insurance re: GLP-1 coverage.    No orders of the defined types were placed in this encounter.   No follow-ups on file.    This visit occurred during the SARS-CoV-2 public health emergency.  Safety protocols were in place, including screening questions prior to the visit, additional usage of staff PPE, and extensive cleaning of exam room while observing appropriate contact time as indicated for disinfecting solutions.

## 2023-03-23 ENCOUNTER — Encounter: Payer: Self-pay | Admitting: Physical Therapy

## 2023-03-23 ENCOUNTER — Ambulatory Visit: Payer: Commercial Managed Care - HMO | Admitting: Physical Therapy

## 2023-03-23 DIAGNOSIS — R600 Localized edema: Secondary | ICD-10-CM

## 2023-03-23 DIAGNOSIS — M25672 Stiffness of left ankle, not elsewhere classified: Secondary | ICD-10-CM

## 2023-03-23 DIAGNOSIS — M25572 Pain in left ankle and joints of left foot: Secondary | ICD-10-CM

## 2023-03-23 NOTE — Therapy (Signed)
OUTPATIENT PHYSICAL THERAPY LOWER EXTREMITY TREATMENT   Patient Name: Dakota Wolf MRN: [id number] DOB:09-02-1990, 32 y.o., male Today's Date: 03/23/2023  END OF SESSION:  PT End of Session - 03/23/23 0844     Visit Number 3    Date for PT Re-Evaluation 05/02/23    Authorization Type [employer]    Authorization Time Period No auth needed    Progress Note Due on Visit 10    PT Start Time 0804    PT Stop Time 0844    PT Time Calculation (min) 40 min              Past Medical History:  Diagnosis Date   Hypertension    Past Surgical History:  Procedure Laterality Date   ENDOVENOUS ABLATION SAPHENOUS VEIN W/ LASER Left 07/14/2022   endovenous laser ablation left greater saphenous vein and stab phlebectomy >20 incisions left leg by Lemar Livings MD   Patient Active Problem List   Diagnosis Date Noted   Morbid obesity (HCC) 03/02/2023   Fracture of navicular bone of left foot 01/11/2023   Obstructive sleep apnea 11/06/2022   Hypogonadism in male 07/10/2022   Well adult exam 06/07/2022   Screening for HIV (human immunodeficiency virus) 09/06/2021   Varicose veins of left lower extremity with pain 09/06/2021   Lumbar radiculitis 05/27/2021   Essential hypertension 05/26/2020   Daytime somnolence 05/26/2020   Possible exposure to STD 05/26/2020    PCP: Everrett Coombe, DO   REFERRING PROVIDER: Monica Becton  REFERRING DIAG: [phone] (ICD-10-CM) - Chronic pain of left ankle   THERAPY DIAG:  Pain in left ankle and joints of left foot  Stiffness of left ankle, not elsewhere classified  Localized edema  Rationale for Evaluation and Treatment: Rehabilitation  ONSET DATE: October 2024  SUBJECTIVE:   SUBJECTIVE STATEMENT: Normally wears ankle immobilizer  PERTINENT HISTORY: HTN, smoker PAIN:  Are you having pain? Yes: NPRS scale: 0/10 Pain location: L ankle Pain description: sharp Aggravating factors: stairs, moving it,  walking without boot Relieving factors: wearing boot, ice, elevation, rest, meloxicam  PRECAUTIONS: None  RED FLAGS: None   WEIGHT BEARING RESTRICTIONS: No  FALLS:  Has patient fallen in last 6 months? Yes. 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 Benjamin Stain  OBJECTIVE:  Note: Objective measures were completed at Evaluation unless otherwise noted.  DIAGNOSTIC FINDINGS: 01/11/23 DG Ankle IMPRESSION: 1. No acute fracture or dislocation. 2. Mild-to-moderate diffuse soft tissue swelling of the visualized calf and ankle.  PATIENT SURVEYS:  LEFS 39/80  COGNITION: Overall cognitive status: Within functional limits for tasks assessed     SENSATION: WFL  EDEMA:  Circumferential: R 64 cm,Left 68 cm   POSTURE: No Significant postural limitations  PALPATION: Tenderness over Left  Anterior talofibular ligament, calcaneofibular ligament, and posterior talofibular ligament.  CFL most tender.    LOWER EXTREMITY MMT:   MMT Right eval Left * eval  Ankle dorsiflexion 5   Ankle plantarflexion 5   Ankle inversion 5 2  Ankle eversion 5 2   (Blank rows = not tested) * based on active movement, no resisted movement due to possible new injury  LOWER EXTREMITY ROM:  PROM Right eval Left eval  Ankle  dorsiflexion 10 -15*  Ankle plantarflexion    Ankle inversion 40 15  Ankle eversion 35 10   (Blank rows = not tested)  LOWER EXTREMITY SPECIAL TESTS:  Ankle special tests: Anterior drawer test: negative  GAIT: Distance walked: 88' Assistive device utilized: None Level of assistance: Complete Independence Comments: in CAM boot on L, antalgic   TODAY'S TREATMENT:                                                                                                                               DATE:  03/23/23 Therapeutic Exercise: to improve strength and mobility.  Demo, verbal and tactile cues throughout for technique. Bike L2 x 6 min  BAPS board seated - Level 1 - DF/PF x 20, Ever/INV x 20, CW/CCW x 10 Seated L toe raises (great toe then lesser toes) x 10 each Seated toe spreads  Seated ankle DF RTB 3 x 20 Seated ankle Eversion RTB 3 x 20 Seated ankle Inversion RTB 3 x 20  Seated ankle PF RTB 3 x 20 Ankle alphabet  HEP update  Gentle STM to L ankle  03/02/23 STM to L gastroc PROM to L ankle   Manual edema resorption to L ankle and lower leg Seated L gastroc stretch with strap 2x30" Seated toe curls x20  Seated toe spreads x 20 Seated L toe raises x 10  02/07/23 EVAL     PATIENT EDUCATION:  Education details: HEP update Person educated: Patient Education method: Explanation, Demonstration, and Handouts Education comprehension: verbalized understanding and returned demonstration  HOME EXERCISE PROGRAM: Access Code: [email or web address] URL: [email or web address] Date: 03/23/2023 Prepared by: Harrie Foreman  Exercises - Long Sitting Calf Stretch with Strap  - 1 x daily - 7 x weekly - 3 sets - 10 reps - Seated Toe Raise  - 1 x daily - 7 x weekly - 3 sets - 10 reps - Towel Scrunches  - 1 x daily - 7 x weekly - 3 sets - 10 reps - Toe Spreading  - 1 x daily - 7 x weekly - 3 sets - 10 reps - Seated Ankle Alphabet  - 1 x daily - 7 x weekly - 1 sets - 1 reps - Toe Yoga - Alternating Great Toe and Lesser Toe Extension  - 1 x daily - 7 x weekly - 3 sets - 10 reps - Seated Ankle Inversion with Resistance and Legs Crossed  - 1 x daily - 7 x weekly - 3 sets - 20 reps - Seated Ankle Plantarflexion with Resistance  - 1 x daily - 7 x weekly - 3 sets - 20 reps - Long Sitting Ankle Eversion with Resistance  - 1 x daily - 7 x weekly - 3 sets - 20 reps - Long Sitting Ankle  Dorsiflexion with Anchored Resistance  - 1 x daily - 7 x weekly - 3 sets - 20 reps ** Instruction on pain-free AROM for L ankle  ASSESSMENT:  CLINICAL IMPRESSION: Dakota Wolf has not yet seen Dr. Karie Schwalbe.  Sent message to Dr. Karie Schwalbe to ensure ok with continued PT, continued to work with Dakota in pain free ROM in NWB position today.  He arrived without ankle immobilizer but assured PT just forgot to don this morning, normally does use, and had not pain.  Started with gentle resistance with 4-way ankle exercises, reported at end of session some pain along lateral edge of foot but immediately stopped after exercise, no pain or tenderness now with palpation.   Progressed HEP and cautioned to continue to perform in pain free ROM.  Dakota Wolf continues to demonstrate potential for improvement and would benefit from continued skilled therapy to address impairments.     OBJECTIVE IMPAIRMENTS: Abnormal gait, decreased activity tolerance, decreased balance, decreased endurance, decreased mobility, difficulty walking, decreased ROM, decreased strength, increased edema, increased muscle spasms, and pain.   ACTIVITY LIMITATIONS: standing, squatting, stairs, and locomotion level  PARTICIPATION LIMITATIONS: meal prep, cleaning, laundry, shopping, community activity, occupation, and yard work  PERSONAL FACTORS: Past/current experiences, Time since onset of injury/illness/exacerbation, and 1-2 comorbidities: previous ankle sprain, smoker  are also affecting patient's functional outcome.   REHAB POTENTIAL: Good  CLINICAL DECISION MAKING: Evolving/moderate complexity  EVALUATION COMPLEXITY: Low   GOALS: Goals reviewed with patient? Yes  SHORT TERM GOALS: Target date: 03/07/2023   Patient will be independent with initial HEP. Baseline: needs  Goal status: IN PROGRESS 03/23/23- MET  LONG TERM GOALS: Target date: 05/02/2023   Patient will be independent with advanced/ongoing HEP to improve outcomes and  carryover.  Baseline:  Goal status: IN PROGRESS  2.  Patient will report at least 75% improvement in Left ankle/foot pain to improve QOL. Baseline:  Goal status: IN PROGRESS  3.  Patient will demonstrate improved left ankle AROM to WNL to allow for normal gait and stair mechanics. Baseline: see objective Goal status: IN PROGRESS  4.  Patient will demonstrate improved functional LE strength as demonstrated by 5/5 L ankle strength. Baseline: see objective Goal status: IN PROGRESS  5.  Patient will be able to ascend/descend stairs with reciprocal step pattern safely to access home and community.  Baseline: step to Goal status: IN PROGRESS  6.  Patient will report at least 9 points improvement on LEFS to demonstrate improved functional ability. Baseline: 39/80 Goal status: IN PROGRESS  PLAN:  PT FREQUENCY: 1-2x/week  PT DURATION: 12 weeks (extended due to holiday, patient going out of town, and healing time for recent injury)  PLANNED INTERVENTIONS: 97110-Therapeutic exercises, 97530- Therapeutic activity, [id number]- Neuromuscular re-education, 97535- Self Care, 09604- Manual therapy, [id number]- Gait training, [id number]- Orthotic Fit/training, [id number]- Splinting, 97014- Electrical stimulation (unattended), [id number]- Electrical stimulation (manual), 97016- Vasopneumatic device, [id number]- Ionotophoresis 4mg /ml Dexamethasone, Patient/Family education, Balance training, Stair training, Taping, Dry Needling, Joint mobilization, Joint manipulation, Cryotherapy, and Moist heat  PLAN FOR NEXT SESSION: progress HEP as tolerated, continue NWB, proprioception ankle exercises.    Jena Gauss, PT 03/23/2023, 8:50 AM

## 2023-03-27 ENCOUNTER — Encounter: Payer: Self-pay | Admitting: Physical Therapy

## 2023-03-27 ENCOUNTER — Ambulatory Visit: Payer: Commercial Managed Care - HMO | Admitting: Physical Therapy

## 2023-03-27 DIAGNOSIS — R600 Localized edema: Secondary | ICD-10-CM

## 2023-03-27 DIAGNOSIS — M25572 Pain in left ankle and joints of left foot: Secondary | ICD-10-CM

## 2023-03-27 DIAGNOSIS — M25672 Stiffness of left ankle, not elsewhere classified: Secondary | ICD-10-CM

## 2023-03-27 NOTE — Therapy (Signed)
OUTPATIENT PHYSICAL THERAPY LOWER EXTREMITY TREATMENT   Patient Name: Dakota Wolf MRN: [id number] DOB:1990-04-14, 32 y.o., male Today's Date: 03/27/2023  END OF SESSION:  PT End of Session - 03/27/23 0807     Visit Number 4    Date for PT Re-Evaluation 05/02/23    Authorization Type [employer]    Authorization Time Period No auth needed    Progress Note Due on Visit 10    PT Start Time 0803    PT Stop Time 0845    PT Time Calculation (min) 42 min    Activity Tolerance Patient tolerated treatment well    Behavior During Therapy [facility] for tasks assessed/performed              Past Medical History:  Diagnosis Date   Hypertension    Past Surgical History:  Procedure Laterality Date   ENDOVENOUS ABLATION SAPHENOUS VEIN W/ LASER Left 07/14/2022   endovenous laser ablation left greater saphenous vein and stab phlebectomy >20 incisions left leg by Lemar Livings MD   Patient Active Problem List   Diagnosis Date Noted   Morbid obesity (HCC) 03/02/2023   Fracture of navicular bone of left foot 01/11/2023   Obstructive sleep apnea 11/06/2022   Hypogonadism in male 07/10/2022   Well adult exam 06/07/2022   Screening for HIV (human immunodeficiency virus) 09/06/2021   Varicose veins of left lower extremity with pain 09/06/2021   Lumbar radiculitis 05/27/2021   Essential hypertension 05/26/2020   Daytime somnolence 05/26/2020   Possible exposure to STD 05/26/2020    PCP: Everrett Coombe, DO   REFERRING PROVIDER: Monica Becton  REFERRING DIAG: [phone] (ICD-10-CM) - Chronic pain of left ankle   THERAPY DIAG:  Pain in left ankle and joints of left foot  Stiffness of left ankle, not elsewhere classified  Localized edema  Rationale for Evaluation and Treatment: Rehabilitation  ONSET DATE: October 2024  SUBJECTIVE:   SUBJECTIVE STATEMENT: Doing well, no pain today.    PERTINENT HISTORY: HTN, smoker PAIN:  Are you having pain? Yes:  NPRS scale: 0/10 Pain location: L ankle Pain description: sharp Aggravating factors: stairs, moving it, walking without boot Relieving factors: wearing boot, ice, elevation, rest, meloxicam  PRECAUTIONS: None  RED FLAGS: None   WEIGHT BEARING RESTRICTIONS: No  FALLS:  Has patient fallen in last 6 months? Yes. 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 Benjamin Stain  OBJECTIVE:  Note: Objective measures were completed at Evaluation unless otherwise noted.  DIAGNOSTIC FINDINGS: 01/11/23 DG Ankle IMPRESSION: 1. No acute fracture or dislocation. 2. Mild-to-moderate diffuse soft tissue swelling of the visualized calf and ankle.  PATIENT SURVEYS:  LEFS 39/80  COGNITION: Overall cognitive status: Within functional limits for tasks assessed     SENSATION: WFL  EDEMA:  Circumferential: R 64 cm,Left 68 cm  03/27/23- no change.  POSTURE: No Significant postural limitations  PALPATION: Tenderness over Left  Anterior talofibular ligament, calcaneofibular ligament, and posterior talofibular ligament.  CFL most tender.    LOWER EXTREMITY MMT:   MMT Right eval Left * eval  Ankle dorsiflexion 5   Ankle plantarflexion 5   Ankle inversion 5 2  Ankle eversion 5 2   (Blank rows = not tested) * based  on active movement, no resisted movement due to possible new injury  LOWER EXTREMITY ROM:  PROM Right eval Left eval  Ankle dorsiflexion 10 -15*  Ankle plantarflexion    Ankle inversion 40 15  Ankle eversion 35 10   (Blank rows = not tested)  LOWER EXTREMITY SPECIAL TESTS:  Ankle special tests: Anterior drawer test: negative  GAIT: Distance walked: 68' Assistive device utilized:  None Level of assistance: Complete Independence Comments: in CAM boot on L, antalgic   TODAY'S TREATMENT:                                                                                                                              DATE:   03/27/23 Therapeutic Exercise: to improve strength and mobility.  Demo, verbal and tactile cues throughout for technique. Bike L3 x 6 min  BAPS board seated - Level 2 - DF/PF x 20, Ever/INV x 20, CW/CCW x 10 Seated ankle raises x 20 Seated resisted great toe presses 2 x 20 GTB Seated ankle DF GTB 3 x 20 Seated ankle Eversion GTB 3 x 20 Seated ankle Inversion GTB 3 x 20  Seated ankle PF GTB 3 x 20 Manual Therapy: to decrease muscle spasm and pain and improve mobility Manual edema resorption techniques to    03/23/23 Therapeutic Exercise: to improve strength and mobility.  Demo, verbal and tactile cues throughout for technique. Bike L2 x 6 min  BAPS board seated - Level 1 - DF/PF x 20, Ever/INV x 20, CW/CCW x 10 Seated L toe raises (great toe then lesser toes) x 10 each Seated toe spreads  Seated ankle DF RTB 3 x 20 Seated ankle Eversion RTB 3 x 20 Seated ankle Inversion RTB 3 x 20  Seated ankle PF RTB 3 x 20 Ankle alphabet  HEP update  Gentle STM to L ankle  03/02/23 STM to L gastroc PROM to L ankle   Manual edema resorption to L ankle and lower leg Seated L gastroc stretch with strap 2x30" Seated toe curls x20  Seated toe spreads x 20 Seated L toe raises x 10  02/07/23 EVAL     PATIENT EDUCATION:  Education details: HEP update Person educated: Patient Education method: Explanation, Demonstration, and Handouts Education comprehension: verbalized understanding and returned demonstration  HOME EXERCISE PROGRAM: Access Code: [email or web address] URL: [email or web address] Date: 03/23/2023 Prepared by: Harrie Foreman  Exercises - Long Sitting Calf Stretch with Strap  - 1 x daily - 7 x weekly - 3 sets - 10 reps - Seated Toe  Raise  - 1 x daily - 7 x weekly - 3 sets - 10 reps - Towel Scrunches  - 1 x daily - 7 x weekly - 3 sets - 10 reps - Toe Spreading  - 1 x daily - 7 x weekly - 3 sets - 10 reps - Seated Ankle Alphabet  - 1 x daily - 7 x weekly - 1 sets - 1 reps -  Toe Yoga - Alternating Great Toe and Lesser Toe Extension  - 1 x daily - 7 x weekly - 3 sets - 10 reps - Seated Ankle Inversion with Resistance and Legs Crossed  - 1 x daily - 7 x weekly - 3 sets - 20 reps - Seated Ankle Plantarflexion with Resistance  - 1 x daily - 7 x weekly - 3 sets - 20 reps - Long Sitting Ankle Eversion with Resistance  - 1 x daily - 7 x weekly - 3 sets - 20 reps - Long Sitting Ankle Dorsiflexion with Anchored Resistance  - 1 x daily - 7 x weekly - 3 sets - 20 reps ** Instruction on pain-free AROM for L ankle  ASSESSMENT:  CLINICAL IMPRESSION: Dakota Yutzy reported no difficulty with theraband exercises and minimal discomfort.  Today progressed to Green theraband, was able to perform all reps without rest break or pain, so issued blue tband for home use.  MD had approved non-weightbearing exercise out of boot, but considering progress will check on when weight bearing exercise out of boot will be allowed.  Also discussed different dynamic ankle braces on the market now for when out of the boot to provide support.  (Power Step Dynamic Ankle support sock may be an option).  Still has edema in L ankle compared to R so continued with manual edema resorption techniques to address.   Dakota Hlavac continues to demonstrate potential for improvement and would benefit from continued skilled therapy to address impairments.     OBJECTIVE IMPAIRMENTS: Abnormal gait, decreased activity tolerance, decreased balance, decreased endurance, decreased mobility, difficulty walking, decreased ROM, decreased strength, increased edema, increased muscle spasms, and pain.   ACTIVITY LIMITATIONS: standing, squatting, stairs, and locomotion  level  PARTICIPATION LIMITATIONS: meal prep, cleaning, laundry, shopping, community activity, occupation, and yard work  PERSONAL FACTORS: Past/current experiences, Time since onset of injury/illness/exacerbation, and 1-2 comorbidities: previous ankle sprain, smoker  are also affecting patient's functional outcome.   REHAB POTENTIAL: Good  CLINICAL DECISION MAKING: Evolving/moderate complexity  EVALUATION COMPLEXITY: Low   GOALS: Goals reviewed with patient? Yes  SHORT TERM GOALS: Target date: 03/07/2023   Patient will be independent with initial HEP. Baseline: needs  Goal status: IN PROGRESS 03/23/23- MET  LONG TERM GOALS: Target date: 05/02/2023   Patient will be independent with advanced/ongoing HEP to improve outcomes and carryover.  Baseline:  Goal status: IN PROGRESS  2.  Patient will report at least 75% improvement in Left ankle/foot pain to improve QOL. Baseline:  Goal status: IN PROGRESS  3.  Patient will demonstrate improved left ankle AROM to WNL to allow for normal gait and stair mechanics. Baseline: see objective Goal status: IN PROGRESS  4.  Patient will demonstrate improved functional LE strength as demonstrated by 5/5 L ankle strength. Baseline: see objective Goal status: IN PROGRESS  5.  Patient will be able to ascend/descend stairs with reciprocal step pattern safely to access home and community.  Baseline: step to Goal status: IN PROGRESS  6.  Patient will report at least 9 points improvement on LEFS to demonstrate improved functional ability. Baseline: 39/80 Goal status: IN PROGRESS  PLAN:  PT FREQUENCY: 1-2x/week  PT DURATION: 12 weeks (extended due to holiday, patient going out of town, and healing time for recent injury)  PLANNED INTERVENTIONS: 97110-Therapeutic exercises, 97530- Therapeutic activity, [id number]- Neuromuscular re-education, 97535- Self Care, 36644- Manual therapy, [id number]- Gait training, [id number]- Orthotic Fit/training, [id number]-  Splinting, 97014- Electrical stimulation (unattended), [id number]- Electrical stimulation (manual), [id number]-  Vasopneumatic device, [id number]- Ionotophoresis 4mg /ml Dexamethasone, Patient/Family education, Balance training, Stair training, Taping, Dry Needling, Joint mobilization, Joint manipulation, Cryotherapy, and Moist heat  PLAN FOR NEXT SESSION: progress HEP as tolerated, continue NWB, proprioception ankle exercises.    Jena Gauss, PT 03/27/2023, 1:09 PM

## 2023-03-30 ENCOUNTER — Ambulatory Visit: Payer: Commercial Managed Care - HMO | Attending: Sports Medicine

## 2023-03-30 DIAGNOSIS — R600 Localized edema: Secondary | ICD-10-CM | POA: Diagnosis present

## 2023-03-30 DIAGNOSIS — M25572 Pain in left ankle and joints of left foot: Secondary | ICD-10-CM | POA: Diagnosis present

## 2023-03-30 DIAGNOSIS — M25672 Stiffness of left ankle, not elsewhere classified: Secondary | ICD-10-CM | POA: Insufficient documentation

## 2023-03-30 NOTE — Therapy (Signed)
 OUTPATIENT PHYSICAL THERAPY LOWER EXTREMITY TREATMENT   Patient Name: Dakota Wolf MRN: [id number] DOB:06-Jan-1991, 33 y.o., male Today's Date: 03/30/2023  END OF SESSION:  PT End of Session - 03/30/23 0806     Visit Number 5    Date for PT Re-Evaluation 05/02/23    Authorization Type [employer]    Authorization Time Period No auth needed    Progress Note Due on Visit 10    PT Start Time 0803    PT Stop Time 0841    PT Time Calculation (min) 38 min    Activity Tolerance Patient tolerated treatment well    Behavior During Therapy [facility] for tasks assessed/performed               Past Medical History:  Diagnosis Date   Hypertension    Past Surgical History:  Procedure Laterality Date   ENDOVENOUS ABLATION SAPHENOUS VEIN W/ LASER Left 07/14/2022   endovenous laser ablation left greater saphenous vein and stab phlebectomy >20 incisions left leg by [name]   Patient Active Problem List   Diagnosis Date Noted   Morbid obesity (HCC) 03/02/2023   Fracture of navicular bone of left foot 01/11/2023   Obstructive sleep apnea 11/06/2022   Hypogonadism in male 07/10/2022   Well adult exam 06/07/2022   Screening for HIV (human immunodeficiency virus) 09/06/2021   Varicose veins of left lower extremity with pain 09/06/2021   Lumbar radiculitis 05/27/2021   Essential hypertension 05/26/2020   Daytime somnolence 05/26/2020   Possible exposure to STD 05/26/2020    PCP: [name], DO   REFERRING PROVIDER: Curtis Debby JINNY DEWAINE  REFERRING DIAG: [phone] (ICD-10-CM) - Chronic pain of left ankle   THERAPY DIAG:  Pain in left ankle and joints of left foot  Stiffness of left ankle, not elsewhere classified  Localized edema  Rationale for Evaluation and Treatment: Rehabilitation  ONSET DATE: October 2024  SUBJECTIVE:   SUBJECTIVE STATEMENT: No pain today.  PERTINENT HISTORY: HTN, smoker PAIN:  Are you having pain? Yes: NPRS scale:  0/10 Pain location: L ankle Pain description: sharp Aggravating factors: stairs, moving it, walking without boot Relieving factors: wearing boot, ice, elevation, rest, meloxicam   PRECAUTIONS: None  RED FLAGS: None   WEIGHT BEARING RESTRICTIONS: No  FALLS:  Has patient fallen in last 6 months? Yes. 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 Curtis  OBJECTIVE:  Note: Objective measures were completed at Evaluation unless otherwise noted.  DIAGNOSTIC FINDINGS: 01/11/23 DG Ankle IMPRESSION: 1. No acute fracture or dislocation. 2. Mild-to-moderate diffuse soft tissue swelling of the visualized calf and ankle.  PATIENT SURVEYS:  LEFS 39/80  COGNITION: Overall cognitive status: Within functional limits for tasks assessed     SENSATION: WFL  EDEMA:  Circumferential: R 64 cm,Left 68 cm  03/27/23- no change.  POSTURE: No Significant postural limitations  PALPATION: Tenderness over Left  Anterior talofibular ligament, calcaneofibular ligament, and posterior talofibular ligament.  CFL most tender.    LOWER EXTREMITY MMT:   MMT Right eval Left * eval  Ankle dorsiflexion 5   Ankle plantarflexion 5   Ankle inversion 5 2  Ankle eversion 5 2   (Blank rows = not tested) * based on active movement,  no resisted movement due to possible new injury  LOWER EXTREMITY ROM:  PROM Right eval Left eval  Ankle dorsiflexion 10 -15*  Ankle plantarflexion    Ankle inversion 40 15  Ankle eversion 35 10   (Blank rows = not tested)  LOWER EXTREMITY SPECIAL TESTS:  Ankle special tests: Anterior drawer test: negative  GAIT: Distance walked: 83' Assistive device utilized: None Level of  assistance: Complete Independence Comments: in CAM boot on L, antalgic   TODAY'S TREATMENT:                                                                                                                              DATE:  03/30/23 Therapeutic Exercise: to improve strength and mobility.  Demo, verbal and tactile cues throughout for technique. Nustep L5x27min LE only BAPS board level 2- all directions x 20; added 2.5 weight AL and AM for EV/IV Slant board PF/DF/EV/IV x 20 Seated blue TB PF x 20  Seated ankle DF/EV/IV x 20 GTB Seated isometric heel raise into BOSU ball  Manual Therapy: to decrease muscle spasm and pain and improve mobility Manual edema resorption and retrograde massage to L ankle  03/27/23 Therapeutic Exercise: to improve strength and mobility.  Demo, verbal and tactile cues throughout for technique. Bike L3 x 6 min  BAPS board seated - Level 2 - DF/PF x 20, Ever/INV x 20, CW/CCW x 10 Seated ankle raises x 20 Seated resisted great toe presses 2 x 20 GTB Seated ankle DF GTB 3 x 20 Seated ankle Eversion GTB 3 x 20 Seated ankle Inversion GTB 3 x 20  Seated ankle PF GTB 3 x 20 Manual Therapy: to decrease muscle spasm and pain and improve mobility Manual edema resorption techniques to    03/23/23 Therapeutic Exercise: to improve strength and mobility.  Demo, verbal and tactile cues throughout for technique. Bike L2 x 6 min  BAPS board seated - Level 1 - DF/PF x 20, Ever/INV x 20, CW/CCW x 10 Seated L toe raises (great toe then lesser toes) x 10 each Seated toe spreads  Seated ankle DF RTB 3 x 20 Seated ankle Eversion RTB 3 x 20 Seated ankle Inversion RTB 3 x 20  Seated ankle PF RTB 3 x 20 Ankle alphabet  HEP update  Gentle STM to L ankle  03/02/23 STM to L gastroc PROM to L ankle   Manual edema resorption to L ankle and lower leg Seated L gastroc stretch with strap 2x30 Seated toe curls x20  Seated toe spreads x 20 Seated L toe raises x 10  02/07/23 EVAL      PATIENT EDUCATION:  Education details: HEP update Person educated: Patient Education method: Explanation, Demonstration, and Handouts Education comprehension: verbalized understanding and returned demonstration  HOME EXERCISE PROGRAM: Access Code: [email or web address] URL: [email or web address] Date: 03/23/2023 Prepared by: Almarie Sprinkles  Exercises - Long Sitting Calf Stretch with Strap  - 1 x daily - 7 x weekly -  3 sets - 10 reps - Seated Toe Raise  - 1 x daily - 7 x weekly - 3 sets - 10 reps - Towel Scrunches  - 1 x daily - 7 x weekly - 3 sets - 10 reps - Toe Spreading  - 1 x daily - 7 x weekly - 3 sets - 10 reps - Seated Ankle Alphabet  - 1 x daily - 7 x weekly - 1 sets - 1 reps - Toe Yoga - Alternating Great Toe and Lesser Toe Extension  - 1 x daily - 7 x weekly - 3 sets - 10 reps - Seated Ankle Inversion with Resistance and Legs Crossed  - 1 x daily - 7 x weekly - 3 sets - 20 reps - Seated Ankle Plantarflexion with Resistance  - 1 x daily - 7 x weekly - 3 sets - 20 reps - Long Sitting Ankle Eversion with Resistance  - 1 x daily - 7 x weekly - 3 sets - 20 reps - Long Sitting Ankle Dorsiflexion with Anchored Resistance  - 1 x daily - 7 x weekly - 3 sets - 20 reps ** Instruction on pain-free AROM for L ankle  ASSESSMENT:  CLINICAL IMPRESSION: Pt responded well to the interventions today. Still working on [employer] proprioceptive exercises and strengthening. He needed cues to avoid hip ER with ankle EV with TB. MT given to reduce edema in L ankle. Jakhi Gernert continues to demonstrate potential for improvement and would benefit from continued skilled therapy to address impairments.     OBJECTIVE IMPAIRMENTS: Abnormal gait, decreased activity tolerance, decreased balance, decreased endurance, decreased mobility, difficulty walking, decreased ROM, decreased strength, increased edema, increased muscle spasms, and pain.   ACTIVITY LIMITATIONS: standing, squatting, stairs, and  locomotion level  PARTICIPATION LIMITATIONS: meal prep, cleaning, laundry, shopping, community activity, occupation, and yard work  PERSONAL FACTORS: Past/current experiences, Time since onset of injury/illness/exacerbation, and 1-2 comorbidities: previous ankle sprain, smoker  are also affecting patient's functional outcome.   REHAB POTENTIAL: Good  CLINICAL DECISION MAKING: Evolving/moderate complexity  EVALUATION COMPLEXITY: Low   GOALS: Goals reviewed with patient? Yes  SHORT TERM GOALS: Target date: 03/07/2023   Patient will be independent with initial HEP. Baseline: needs  Goal status: IN PROGRESS 03/23/23- MET  LONG TERM GOALS: Target date: 05/02/2023   Patient will be independent with advanced/ongoing HEP to improve outcomes and carryover.  Baseline:  Goal status: IN PROGRESS  2.  Patient will report at least 75% improvement in Left ankle/foot pain to improve QOL. Baseline:  Goal status: MET- pt denies any pain 03/30/23  3.  Patient will demonstrate improved left ankle AROM to WNL to allow for normal gait and stair mechanics. Baseline: see objective Goal status: IN PROGRESS  4.  Patient will demonstrate improved functional LE strength as demonstrated by 5/5 L ankle strength. Baseline: see objective Goal status: IN PROGRESS  5.  Patient will be able to ascend/descend stairs with reciprocal step pattern safely to access home and community.  Baseline: step to Goal status: IN PROGRESS  6.  Patient will report at least 9 points improvement on LEFS to demonstrate improved functional ability. Baseline: 39/80 Goal status: IN PROGRESS  PLAN:  PT FREQUENCY: 1-2x/week  PT DURATION: 12 weeks (extended due to holiday, patient going out of town, and healing time for recent injury)  PLANNED INTERVENTIONS: 97110-Therapeutic exercises, 97530- Therapeutic activity, [id number]- Neuromuscular re-education, 97535- Self Care, 02859- Manual therapy, [id number]- Gait training, [id number]- Orthotic  Fit/training, [id number]- Splinting, [id number]- Electrical  stimulation (unattended), [id number]- Electrical stimulation (manual), 02983- Vasopneumatic device, [id number]- Ionotophoresis 4mg /ml Dexamethasone, Patient/Family education, Balance training, Stair training, Taping, Dry Needling, Joint mobilization, Joint manipulation, Cryotherapy, and Moist heat  PLAN FOR NEXT SESSION: progress HEP as tolerated, continue NWB, proprioception ankle exercises.    Sid Greener L Dekota Kirlin, PTA 03/30/2023, 8:53 AM

## 2023-04-03 ENCOUNTER — Ambulatory Visit: Payer: Commercial Managed Care - HMO | Admitting: Physical Therapy

## 2023-04-03 ENCOUNTER — Encounter: Payer: Self-pay | Admitting: Physical Therapy

## 2023-04-03 DIAGNOSIS — M25572 Pain in left ankle and joints of left foot: Secondary | ICD-10-CM | POA: Diagnosis not present

## 2023-04-03 DIAGNOSIS — M25672 Stiffness of left ankle, not elsewhere classified: Secondary | ICD-10-CM

## 2023-04-03 DIAGNOSIS — R600 Localized edema: Secondary | ICD-10-CM

## 2023-04-03 NOTE — Therapy (Signed)
 OUTPATIENT PHYSICAL THERAPY LOWER EXTREMITY TREATMENT   Patient Name: Dakota Wolf MRN: [id number] DOB:[birth year], 33 y.o., male Today's Date: 04/03/2023  END OF SESSION:  PT End of Session - 04/03/23 0801     Visit Number 6    Date for PT Re-Evaluation 05/02/23    Authorization Type [employer]    Authorization Time Period No auth needed    Progress Note Due on Visit 10    PT Start Time 0801    PT Stop Time 0843    PT Time Calculation (min) 42 min    Activity Tolerance Patient tolerated treatment well    Behavior During Therapy [facility] for tasks assessed/performed               Past Medical History:  Diagnosis Date   Hypertension    Past Surgical History:  Procedure Laterality Date   ENDOVENOUS ABLATION SAPHENOUS VEIN W/ LASER Left 07/14/2022   endovenous laser ablation left greater saphenous vein and stab phlebectomy >20 incisions left leg by [name]   Patient Active Problem List   Diagnosis Date Noted   Morbid obesity (HCC) 03/02/2023   Fracture of navicular bone of left foot 01/11/2023   Obstructive sleep apnea 11/06/2022   Hypogonadism in male 07/10/2022   Well adult exam 06/07/2022   Screening for HIV (human immunodeficiency virus) 09/06/2021   Varicose veins of left lower extremity with pain 09/06/2021   Lumbar radiculitis 05/27/2021   Essential hypertension 05/26/2020   Daytime somnolence 05/26/2020   Possible exposure to STD 05/26/2020    PCP: [name], DO   REFERRING PROVIDER: Curtis Debby JINNY DEWAINE  REFERRING DIAG: [phone] (ICD-10-CM) - Chronic pain of left ankle   THERAPY DIAG:  Pain in left ankle and joints of left foot  Stiffness of left ankle, not elsewhere classified  Localized edema  Rationale for Evaluation and Treatment: Rehabilitation  ONSET DATE: October 2024  SUBJECTIVE:   SUBJECTIVE STATEMENT: No pain today.  Has stopped wearing CAM boot last few days, just trying to walk normally.    PERTINENT HISTORY: HTN, smoker PAIN:  Are you having pain? Yes: NPRS scale: 0/10 Pain location: L ankle Pain description: sharp Aggravating factors: stairs, Relieving factors: ice, elevation, rest, meloxicam   PRECAUTIONS: None  RED FLAGS: None   WEIGHT BEARING RESTRICTIONS: No  FALLS:  Has patient fallen in last 6 months? Yes. 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 Curtis  OBJECTIVE:  Note: Objective measures were completed at Evaluation unless otherwise noted.  DIAGNOSTIC FINDINGS: 01/11/23 DG Ankle IMPRESSION: 1. No acute fracture or dislocation. 2. Mild-to-moderate diffuse soft tissue swelling of the visualized calf and ankle.  PATIENT SURVEYS:  LEFS 39/80  COGNITION: Overall cognitive status: Within functional limits for tasks assessed     SENSATION: WFL  EDEMA:  Circumferential: R 64 cm,Left 68 cm  03/27/23- no change.  POSTURE: No Significant postural limitations  PALPATION: Tenderness over Left  Anterior talofibular ligament, calcaneofibular ligament, and posterior talofibular ligament.  CFL most tender.    LOWER EXTREMITY MMT:   MMT Right eval Left * eval  Ankle dorsiflexion 5   Ankle plantarflexion 5   Ankle inversion 5 2  Ankle eversion 5 2   (Blank rows =  not tested) * based on active movement, no resisted movement due to possible new injury  LOWER EXTREMITY ROM:  PROM Right eval Left eval  Ankle dorsiflexion 10 -15*  Ankle plantarflexion    Ankle inversion 40 15  Ankle eversion 35 10   (Blank rows = not tested)  LOWER EXTREMITY SPECIAL TESTS:  Ankle special tests: Anterior drawer test: negative  GAIT: Distance walked: 40' Assistive  device utilized: None Level of assistance: Complete Independence Comments: in CAM boot on L, antalgic   TODAY'S TREATMENT:                                                                                                                              DATE:   04/03/2023 Therapeutic Exercise: to improve strength and mobility.  Demo, verbal and tactile cues throughout for technique. Bike L 3 x 5 min  Church pews on airex 2 x 20 BAPS board Level 3 (seated) DF/PF 3 x 20 (last 2 sets with 2# weights ant & post), Inv/Ever 3 x 20 (added 2# weights AL and AM), CW& CCW circles (2# weighs AL & AM) Toe raises against wall 2 x 20 Squats on bosu (flat side) x 20  Neuromuscular Reeducation: to improve balance and stability. SBA for safety throughout.  Star excursion x 10 each side (cardinal points) Forward taps on 2 step x 10 each side Touch back squats on 2 step x 10 each side M/L weight shifts on slant board x 20 F/B weight shifts on slant board x 20 Single leg RDLs  x 10 each side  03/30/23 Therapeutic Exercise: to improve strength and mobility.  Demo, verbal and tactile cues throughout for technique. Nustep L5x65min LE only BAPS board level 2- all directions x 20; added 2.5 weight AL and AM for EV/IV Slant board PF/DF/EV/IV x 20 Seated blue TB PF x 20  Seated ankle DF/EV/IV x 20 GTB Seated isometric heel raise into BOSU ball  Manual Therapy: to decrease muscle spasm and pain and improve mobility Manual edema resorption and retrograde massage to L ankle  03/27/23 Therapeutic Exercise: to improve strength and mobility.  Demo, verbal and tactile cues throughout for technique. Bike L3 x 6 min  BAPS board seated - Level 2 - DF/PF x 20, Ever/INV x 20, CW/CCW x 10 Seated ankle raises x 20 Seated resisted great toe presses 2 x 20 GTB Seated ankle DF GTB 3 x 20 Seated ankle Eversion GTB 3 x 20 Seated ankle Inversion GTB 3 x 20  Seated ankle PF GTB 3 x 20 Manual Therapy: to decrease muscle spasm and  pain and improve mobility Manual edema resorption techniques to     PATIENT EDUCATION:  Education details: HEP update Person educated: Patient Education method: Explanation, Demonstration, and Handouts Education comprehension: verbalized understanding and returned demonstration  HOME EXERCISE PROGRAM: Access Code: [email or web address] URL: [email or web address] Date: 04/03/2023 Prepared by: Almarie Sprinkles  Exercises - Long Sitting Calf Stretch with Strap  -  1 x daily - 7 x weekly - 3 sets - 10 reps - Seated Toe Raise  - 1 x daily - 7 x weekly - 3 sets - 10 reps - Towel Scrunches  - 1 x daily - 7 x weekly - 3 sets - 10 reps - Toe Spreading  - 1 x daily - 7 x weekly - 3 sets - 10 reps - Seated Ankle Alphabet  - 1 x daily - 7 x weekly - 1 sets - 1 reps - Toe Yoga - Alternating Great Toe and Lesser Toe Extension  - 1 x daily - 7 x weekly - 3 sets - 10 reps - Seated Ankle Inversion with Resistance and Legs Crossed  - 1 x daily - 7 x weekly - 3 sets - 20 reps - Seated Ankle Plantarflexion with Resistance  - 1 x daily - 7 x weekly - 3 sets - 20 reps - Long Sitting Ankle Eversion with Resistance  - 1 x daily - 7 x weekly - 3 sets - 20 reps - Long Sitting Ankle Dorsiflexion with Anchored Resistance  - 1 x daily - 7 x weekly - 3 sets - 20 reps - Toe Raise With Back Against Wall  - 1 x daily - 7 x weekly - 3 sets - 10 reps  ASSESSMENT:  CLINICAL IMPRESSION: Ted Chow reports no ankle pain, has stopped wearing CAM boot on own.  Today started working on standing ankle proproprioceptive exercises (permission obtained from referring MD to do WB to tolerance) along with single leg balance and continued ankle strengthening.  Bravlio reported no pain with interventions.  Updated HEP with toe raises for ant tib strengthening. Treshawn Bunner continues to demonstrate potential for improvement and would benefit from continued skilled therapy to address impairments.     OBJECTIVE IMPAIRMENTS:  Abnormal gait, decreased activity tolerance, decreased balance, decreased endurance, decreased mobility, difficulty walking, decreased ROM, decreased strength, increased edema, increased muscle spasms, and pain.   ACTIVITY LIMITATIONS: standing, squatting, stairs, and locomotion level  PARTICIPATION LIMITATIONS: meal prep, cleaning, laundry, shopping, community activity, occupation, and yard work  PERSONAL FACTORS: Past/current experiences, Time since onset of injury/illness/exacerbation, and 1-2 comorbidities: previous ankle sprain, smoker  are also affecting patient's functional outcome.   REHAB POTENTIAL: Good  CLINICAL DECISION MAKING: Evolving/moderate complexity  EVALUATION COMPLEXITY: Low   GOALS: Goals reviewed with patient? Yes  SHORT TERM GOALS: Target date: 03/07/2023   Patient will be independent with initial HEP. Baseline: needs  Goal status: IN PROGRESS 03/23/23- MET  LONG TERM GOALS: Target date: 05/02/2023   Patient will be independent with advanced/ongoing HEP to improve outcomes and carryover.  Baseline:  Goal status: IN PROGRESS  2.  Patient will report at least 75% improvement in Left ankle/foot pain to improve QOL. Baseline:  Goal status: MET- pt denies any pain 03/30/23  3.  Patient will demonstrate improved left ankle AROM to WNL to allow for normal gait and stair mechanics. Baseline: see objective Goal status: IN PROGRESS  4.  Patient will demonstrate improved functional LE strength as demonstrated by 5/5 L ankle strength. Baseline: see objective Goal status: IN PROGRESS  5.  Patient will be able to ascend/descend stairs with reciprocal step pattern safely to access home and community.  Baseline: step to Goal status: IN PROGRESS  6.  Patient will report at least 9 points improvement on LEFS to demonstrate improved functional ability. Baseline: 39/80 Goal status: IN PROGRESS  PLAN:  PT FREQUENCY: 1-2x/week  PT DURATION:  12 weeks (extended due  to holiday, patient going out of town, and healing time for recent injury)  PLANNED INTERVENTIONS: 97110-Therapeutic exercises, 97530- Therapeutic activity, [id number]- Neuromuscular re-education, [phone]- Self Care, 02859- Manual therapy, [id number]- Gait training, [id number]- Orthotic Fit/training, [id number]- Splinting, [id number]- Electrical stimulation (unattended), [id number]- Electrical stimulation (manual), [id number]- Vasopneumatic device, [id number]- Ionotophoresis 4mg /ml Dexamethasone, Patient/Family education, Balance training, Stair training, Taping, Dry Needling, Joint mobilization, Joint manipulation, Cryotherapy, and Moist heat  PLAN FOR NEXT SESSION: WB as tolerated, focus on balance, proprioception and strengthening.    Almarie JINNY Sprinkles, PT, DPT 04/03/2023, 8:50 AM

## 2023-04-06 ENCOUNTER — Ambulatory Visit: Payer: Commercial Managed Care - HMO

## 2023-04-06 DIAGNOSIS — M25572 Pain in left ankle and joints of left foot: Secondary | ICD-10-CM | POA: Diagnosis not present

## 2023-04-06 DIAGNOSIS — R600 Localized edema: Secondary | ICD-10-CM

## 2023-04-06 DIAGNOSIS — M25672 Stiffness of left ankle, not elsewhere classified: Secondary | ICD-10-CM

## 2023-04-06 NOTE — Therapy (Signed)
 OUTPATIENT PHYSICAL THERAPY LOWER EXTREMITY TREATMENT   Patient Name: Dakota Wolf MRN: [id number] DOB:06-20-1990, 33 y.o., male Today's Date: 04/06/2023  END OF SESSION:  PT End of Session - 04/06/23 0846     Visit Number 7    Date for PT Re-Evaluation 05/02/23    Authorization Type [employer]    Authorization Time Period No auth needed    Progress Note Due on Visit 10    PT Start Time 0803    PT Stop Time 0844    PT Time Calculation (min) 41 min    Activity Tolerance Patient tolerated treatment well    Behavior During Therapy [facility] for tasks assessed/performed                Past Medical History:  Diagnosis Date   Hypertension    Past Surgical History:  Procedure Laterality Date   ENDOVENOUS ABLATION SAPHENOUS VEIN W/ LASER Left 07/14/2022   endovenous laser ablation left greater saphenous vein and stab phlebectomy >20 incisions left leg by [name]   Patient Active Problem List   Diagnosis Date Noted   Morbid obesity (HCC) 03/02/2023   Fracture of navicular bone of left foot 01/11/2023   Obstructive sleep apnea 11/06/2022   Hypogonadism in male 07/10/2022   Well adult exam 06/07/2022   Screening for HIV (human immunodeficiency virus) 09/06/2021   Varicose veins of left lower extremity with pain 09/06/2021   Lumbar radiculitis 05/27/2021   Essential hypertension 05/26/2020   Daytime somnolence 05/26/2020   Possible exposure to STD 05/26/2020    PCP: Dakota Bring, DO   REFERRING PROVIDER: Curtis Debby JINNY Wolf  REFERRING DIAG: [phone] (ICD-10-CM) - Chronic pain of left ankle   THERAPY DIAG:  Pain in left ankle and joints of left foot  Stiffness of left ankle, not elsewhere classified  Localized edema  Rationale for Evaluation and Treatment: Rehabilitation  ONSET DATE: October 2024  SUBJECTIVE:   SUBJECTIVE STATEMENT: No pain today.  Has stopped wearing CAM boot last few days, just trying to walk normally.    PERTINENT HISTORY: HTN, smoker PAIN:  Are you having pain? Yes: NPRS scale: 0/10 Pain location: L ankle Pain description: sharp Aggravating factors: stairs, Relieving factors: ice, elevation, rest, meloxicam   PRECAUTIONS: None  RED FLAGS: None   WEIGHT BEARING RESTRICTIONS: No  FALLS:  Has patient fallen in last 6 months? Yes. 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  DIAGNOSTIC FINDINGS: 01/11/23 DG Ankle IMPRESSION: 1. No acute fracture or dislocation. 2. Mild-to-moderate diffuse soft tissue swelling of the visualized calf and ankle.  PATIENT SURVEYS:  LEFS 39/80  COGNITION: Overall cognitive status: Within functional limits for tasks assessed     SENSATION: WFL  EDEMA:  Circumferential: R 64 cm,Left 68 cm  03/27/23- no change.  POSTURE: No Significant postural limitations  PALPATION: Tenderness over Left  Anterior talofibular ligament, calcaneofibular ligament, and posterior talofibular ligament.  CFL most tender.    LOWER EXTREMITY MMT:   MMT Right eval Left * eval  Ankle dorsiflexion 5   Ankle plantarflexion 5   Ankle inversion 5 2  Ankle eversion 5 2   (Blank  rows = not tested) * based on active movement, no resisted movement due to possible new injury  LOWER EXTREMITY ROM:  PROM Right eval Left eval  Ankle dorsiflexion 10 -15*  Ankle plantarflexion    Ankle inversion 40 15  Ankle eversion 35 10   (Blank rows = not tested)  LOWER EXTREMITY SPECIAL TESTS:  Ankle special tests: Anterior drawer test: negative  GAIT: Distance walked: 44' Assistive  device utilized: None Level of assistance: Complete Independence Comments: in CAM boot on L, antalgic   TODAY'S TREATMENT:                                                                                                                              DATE:  04/06/23 Therapeutic Exercise: to improve strength and mobility.  Demo, verbal and tactile cues throughout for technique. Bike L 3 x 5 min Single leg RDL 2 x 10 bil Seated DF with 3lb weight x 20  S/L EV and IV 3lb weight x 20 each Single leg heel raise x 10 B BAPS levels 3 2.5lb PF/DF/EV/IV  Neuromuscular Reeducation: to improve balance and stability. SBA for safety throughout.  Marches standing on airex x 10 B Standing hip abduction x 10 B on airex Standing pallof press blue TB with SLS x 10 Standing oscillations with blue TB x 10 SLS- lateral  04/03/2023 Therapeutic Exercise: to improve strength and mobility.  Demo, verbal and tactile cues throughout for technique. Bike L 3 x 5 min  Church pews on airex 2 x 20 BAPS board Level 3 (seated) DF/PF 3 x 20 (last 2 sets with 2# weights ant & post), Inv/Ever 3 x 20 (added 2# weights AL and AM), CW& CCW circles (2# weighs AL & AM) Toe raises against wall 2 x 20 Squats on bosu (flat side) x 20  Neuromuscular Reeducation: to improve balance and stability. SBA for safety throughout.  Star excursion x 10 each side (cardinal points) Forward taps on 2 step x 10 each side Touch back squats on 2 step x 10 each side M/L weight shifts on slant board x 20 F/B weight shifts on slant board x 20 Single leg RDLs  x 10 each side  03/30/23 Therapeutic Exercise: to improve strength and mobility.  Demo, verbal and tactile cues throughout for technique. Nustep L5x18min LE only BAPS board level 2- all directions x 20; added 2.5 weight AL and AM for EV/IV Slant board PF/DF/EV/IV x 20 Seated blue TB PF x 20  Seated ankle DF/EV/IV x 20 GTB Seated isometric heel raise into BOSU ball  Manual Therapy: to  decrease muscle spasm and pain and improve mobility Manual edema resorption and retrograde massage to L ankle  03/27/23 Therapeutic Exercise: to improve strength and mobility.  Demo, verbal and tactile cues throughout for technique. Bike L3 x 6 min  BAPS board seated - Level 2 - DF/PF x 20, Ever/INV x 20, CW/CCW x 10 Seated ankle raises x  20 Seated resisted great toe presses 2 x 20 GTB Seated ankle DF GTB 3 x 20 Seated ankle Eversion GTB 3 x 20 Seated ankle Inversion GTB 3 x 20  Seated ankle PF GTB 3 x 20 Manual Therapy: to decrease muscle spasm and pain and improve mobility Manual edema resorption techniques to     PATIENT EDUCATION:  Education details: HEP update Person educated: Patient Education method: Explanation, Demonstration, and Handouts Education comprehension: verbalized understanding and returned demonstration  HOME EXERCISE PROGRAM: Access Code: [email or web address] URL: [email or web address] Date: 04/03/2023 Prepared by: Dakota Wolf  Exercises - Long Sitting Calf Stretch with Strap  - 1 x daily - 7 x weekly - 3 sets - 10 reps - Seated Toe Raise  - 1 x daily - 7 x weekly - 3 sets - 10 reps - Towel Scrunches  - 1 x daily - 7 x weekly - 3 sets - 10 reps - Toe Spreading  - 1 x daily - 7 x weekly - 3 sets - 10 reps - Seated Ankle Alphabet  - 1 x daily - 7 x weekly - 1 sets - 1 reps - Toe Yoga - Alternating Great Toe and Lesser Toe Extension  - 1 x daily - 7 x weekly - 3 sets - 10 reps - Seated Ankle Inversion with Resistance and Legs Crossed  - 1 x daily - 7 x weekly - 3 sets - 20 reps - Seated Ankle Plantarflexion with Resistance  - 1 x daily - 7 x weekly - 3 sets - 20 reps - Long Sitting Ankle Eversion with Resistance  - 1 x daily - 7 x weekly - 3 sets - 20 reps - Long Sitting Ankle Dorsiflexion with Anchored Resistance  - 1 x daily - 7 x weekly - 3 sets - 20 reps - Toe Raise With Back Against Wall  - 1 x daily - 7 x weekly - 3 sets - 10  reps  ASSESSMENT:  CLINICAL IMPRESSION: Pt arrives with no new complaints. His biggest challenge was dynamic task with SLS. He showed mild LOB with the airex pad interventions. Otherwise he showed good response to the interventions. Dakota Wolf continues to demonstrate potential for improvement and would benefit from continued skilled therapy to address impairments.     OBJECTIVE IMPAIRMENTS: Abnormal gait, decreased activity tolerance, decreased balance, decreased endurance, decreased mobility, difficulty walking, decreased ROM, decreased strength, increased edema, increased muscle spasms, and pain.   ACTIVITY LIMITATIONS: standing, squatting, stairs, and locomotion level  PARTICIPATION LIMITATIONS: meal prep, cleaning, laundry, shopping, community activity, occupation, and yard work  PERSONAL FACTORS: Past/current experiences, Time since onset of injury/illness/exacerbation, and 1-2 comorbidities: previous ankle sprain, smoker  are also affecting patient's functional outcome.   REHAB POTENTIAL: Good  CLINICAL DECISION MAKING: Evolving/moderate complexity  EVALUATION COMPLEXITY: Low   GOALS: Goals reviewed with patient? Yes  SHORT TERM GOALS: Target date: 03/07/2023   Patient will be independent with initial HEP. Baseline: needs  Goal status: IN PROGRESS 03/23/23- MET  LONG TERM GOALS: Target date: 05/02/2023   Patient will be independent with advanced/ongoing HEP to improve outcomes and carryover.  Baseline:  Goal status: IN PROGRESS  2.  Patient will report at least 75% improvement in Left ankle/foot pain to improve QOL. Baseline:  Goal status: MET- pt denies any pain 03/30/23  3.  Patient will demonstrate improved left ankle AROM to WNL to allow for normal gait and stair mechanics. Baseline: see objective Goal status: IN PROGRESS  4.  Patient  will demonstrate improved functional LE strength as demonstrated by 5/5 L ankle strength. Baseline: see objective Goal status:  IN PROGRESS  5.  Patient will be able to ascend/descend stairs with reciprocal step pattern safely to access home and community.  Baseline: step to Goal status: IN PROGRESS  6.  Patient will report at least 9 points improvement on LEFS to demonstrate improved functional ability. Baseline: 39/80 Goal status: IN PROGRESS  PLAN:  PT FREQUENCY: 1-2x/week  PT DURATION: 12 weeks (extended due to holiday, patient going out of town, and healing time for recent injury)  PLANNED INTERVENTIONS: 97110-Therapeutic exercises, 97530- Therapeutic activity, [id number]- Neuromuscular re-education, 97535- Self Care, 02859- Manual therapy, [id number]- Gait training, [id number]- Orthotic Fit/training, [id number]- Splinting, 97014- Electrical stimulation (unattended), [id number]- Electrical stimulation (manual), 97016- Vasopneumatic device, [id number]- Ionotophoresis 4mg /ml Dexamethasone, Patient/Family education, Balance training, Stair training, Taping, Dry Needling, Joint mobilization, Joint manipulation, Cryotherapy, and Moist heat  PLAN FOR NEXT SESSION: WB as tolerated, focus on balance, proprioception and strengthening.    Dakota Wolf, PTA 04/06/2023, 9:04 AM

## 2023-04-10 ENCOUNTER — Encounter: Payer: Managed Care, Other (non HMO) | Admitting: Physical Therapy

## 2023-04-13 ENCOUNTER — Ambulatory Visit: Payer: Commercial Managed Care - HMO

## 2023-04-13 DIAGNOSIS — M25672 Stiffness of left ankle, not elsewhere classified: Secondary | ICD-10-CM

## 2023-04-13 DIAGNOSIS — M25572 Pain in left ankle and joints of left foot: Secondary | ICD-10-CM | POA: Diagnosis not present

## 2023-04-13 DIAGNOSIS — R600 Localized edema: Secondary | ICD-10-CM

## 2023-04-13 NOTE — Therapy (Signed)
OUTPATIENT PHYSICAL THERAPY LOWER EXTREMITY TREATMENT   Patient Name: Dakota Wolf MRN: [id number] DOB:Jul 22, 1990, 33 y.o., male Today's Date: 04/13/2023  END OF SESSION:  PT End of Session - 04/13/23 0846     Visit Number 8    Date for PT Re-Evaluation 05/02/23    Authorization Type [employer]    Authorization Time Period No auth needed    Progress Note Due on Visit 10    PT Start Time 0803    PT Stop Time 0845    PT Time Calculation (min) 42 min    Activity Tolerance Patient tolerated treatment well    Behavior During Therapy [facility] for tasks assessed/performed                 Past Medical History:  Diagnosis Date   Hypertension    Past Surgical History:  Procedure Laterality Date   ENDOVENOUS ABLATION SAPHENOUS VEIN W/ LASER Left 07/14/2022   endovenous laser ablation left greater saphenous vein and stab phlebectomy >20 incisions left leg by Lemar Livings MD   Patient Active Problem List   Diagnosis Date Noted   Morbid obesity (HCC) 03/02/2023   Fracture of navicular bone of left foot 01/11/2023   Obstructive sleep apnea 11/06/2022   Hypogonadism in male 07/10/2022   Well adult exam 06/07/2022   Screening for HIV (human immunodeficiency virus) 09/06/2021   Varicose veins of left lower extremity with pain 09/06/2021   Lumbar radiculitis 05/27/2021   Essential hypertension 05/26/2020   Daytime somnolence 05/26/2020   Possible exposure to STD 05/26/2020    PCP: Everrett Coombe, DO   REFERRING PROVIDER: Monica Becton,*  REFERRING DIAG: [phone] (ICD-10-CM) - Chronic pain of left ankle   THERAPY DIAG:  Pain in left ankle and joints of left foot  Stiffness of left ankle, not elsewhere classified  Localized edema  Rationale for Evaluation and Treatment: Rehabilitation  ONSET DATE: October 2024  SUBJECTIVE:   SUBJECTIVE STATEMENT: "It feels like I could go play football."  PERTINENT HISTORY: HTN, smoker PAIN:  Are you  having pain? Yes: NPRS scale: 0/10 Pain location: L ankle Pain description: sharp Aggravating factors: stairs, Relieving factors: ice, elevation, rest, meloxicam  PRECAUTIONS: None  RED FLAGS: None   WEIGHT BEARING RESTRICTIONS: No  FALLS:  Has patient fallen in last 6 months? Yes. 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 Benjamin Stain  OBJECTIVE:  Note: Objective measures were completed at Evaluation unless otherwise noted.  DIAGNOSTIC FINDINGS: 01/11/23 DG Ankle IMPRESSION: 1. No acute fracture or dislocation. 2. Mild-to-moderate diffuse soft tissue swelling of the visualized calf and ankle.  PATIENT SURVEYS:  LEFS 39/80  COGNITION: Overall cognitive status: Within functional limits for tasks assessed     SENSATION: WFL  EDEMA:  Circumferential: R 64 cm,Left 68 cm  03/27/23- no change.  POSTURE: No Significant postural limitations  PALPATION: Tenderness over Left  Anterior talofibular ligament, calcaneofibular ligament, and posterior talofibular ligament.  CFL most tender.    LOWER EXTREMITY MMT:   MMT Right eval Left * eval L 04/13/23  Ankle dorsiflexion 5  5  Ankle plantarflexion 5  5  Ankle inversion 5 2 4+  Ankle eversion 5 2 5    (Blank rows = not  tested) * based on active movement, no resisted movement due to possible new injury  LOWER EXTREMITY ROM:  PROM Right eval Left eval L AROM 04/13/23  Ankle dorsiflexion 10 -15* 5  Ankle plantarflexion     Ankle inversion 40 15 19  Ankle eversion 35 10 26   (Blank rows = not tested)  LOWER EXTREMITY SPECIAL TESTS:  Ankle special tests: Anterior drawer test: negative  GAIT: Distance walked: 66' Assistive device  utilized: None Level of assistance: Complete Independence Comments: in CAM boot on L, antalgic   TODAY'S TREATMENT:                                                                                                                              DATE:  04/13/23 Therapeutic Exercise: to improve strength and mobility.  Demo, verbal and tactile cues throughout for technique. Bike L3 x 5 min AROM and strength assessed S/L EV 4lb weight x 20 each S/L IV 4lb weight x 20 each Seated isometric ankle IV with ball 5x10 SLS L 2x30"  04/06/23 Therapeutic Exercise: to improve strength and mobility.  Demo, verbal and tactile cues throughout for technique. Bike L 3 x 5 min Single leg RDL 2 x 10 bil Seated DF with 3lb weight x 20  S/L EV and IV 3lb weight x 20 each Single leg heel raise x 10 B BAPS levels 3 2.5lb PF/DF/EV/IV  Neuromuscular Reeducation: to improve balance and stability. SBA for safety throughout.  Marches standing on airex x 10 B Standing hip abduction x 10 B on airex Standing pallof press blue TB with SLS x 10 Standing oscillations with blue TB x 10 SLS- lateral  04/03/2023 Therapeutic Exercise: to improve strength and mobility.  Demo, verbal and tactile cues throughout for technique. Bike L 3 x 5 min  Church pews on airex 2 x 20 BAPS board Level 3 (seated) DF/PF 3 x 20 (last 2 sets with 2# weights ant & post), Inv/Ever 3 x 20 (added 2# weights AL and AM), CW& CCW circles (2# weighs AL & AM) Toe raises against wall 2 x 20 Squats on bosu (flat side) x 20  Neuromuscular Reeducation: to improve balance and stability. SBA for safety throughout.  Star excursion x 10 each side (cardinal points) Forward taps on 2" step x 10 each side Touch back squats on 2" step x 10 each side M/L weight shifts on slant board x 20 F/B weight shifts on slant board x 20 Single leg RDLs  x 10 each side  03/30/23 Therapeutic Exercise: to improve strength and mobility.  Demo, verbal and tactile cues  throughout for technique. Nustep L5x5min LE only BAPS board level 2- all directions x 20; added 2.5 weight AL and AM for EV/IV Slant board PF/DF/EV/IV x 20 Seated blue TB PF x 20  Seated ankle DF/EV/IV x 20 GTB Seated isometric heel raise into BOSU ball  Manual Therapy: to decrease muscle spasm and pain and  improve mobility Manual edema resorption and retrograde massage to L ankle  03/27/23 Therapeutic Exercise: to improve strength and mobility.  Demo, verbal and tactile cues throughout for technique. Bike L3 x 6 min  BAPS board seated - Level 2 - DF/PF x 20, Ever/INV x 20, CW/CCW x 10 Seated ankle raises x 20 Seated resisted great toe presses 2 x 20 GTB Seated ankle DF GTB 3 x 20 Seated ankle Eversion GTB 3 x 20 Seated ankle Inversion GTB 3 x 20  Seated ankle PF GTB 3 x 20 Manual Therapy: to decrease muscle spasm and pain and improve mobility Manual edema resorption techniques to     PATIENT EDUCATION:  Education details: HEP update Person educated: Patient Education method: Explanation, Demonstration, and Handouts Education comprehension: verbalized understanding and returned demonstration  HOME EXERCISE PROGRAM: Access Code: [email or web address] URL: [email or web address] Date: 04/03/2023 Prepared by: Harrie Foreman  Exercises - Long Sitting Calf Stretch with Strap  - 1 x daily - 7 x weekly - 3 sets - 10 reps - Seated Toe Raise  - 1 x daily - 7 x weekly - 3 sets - 10 reps - Towel Scrunches  - 1 x daily - 7 x weekly - 3 sets - 10 reps - Toe Spreading  - 1 x daily - 7 x weekly - 3 sets - 10 reps - Seated Ankle Alphabet  - 1 x daily - 7 x weekly - 1 sets - 1 reps - Toe Yoga - Alternating Great Toe and Lesser Toe Extension  - 1 x daily - 7 x weekly - 3 sets - 10 reps - Seated Ankle Inversion with Resistance and Legs Crossed  - 1 x daily - 7 x weekly - 3 sets - 20 reps - Seated Ankle Plantarflexion with Resistance  - 1 x daily - 7 x weekly - 3 sets - 20 reps - Long  Sitting Ankle Eversion with Resistance  - 1 x daily - 7 x weekly - 3 sets - 20 reps - Long Sitting Ankle Dorsiflexion with Anchored Resistance  - 1 x daily - 7 x weekly - 3 sets - 20 reps - Toe Raise With Back Against Wall  - 1 x daily - 7 x weekly - 3 sets - 10 reps  ASSESSMENT:  CLINICAL IMPRESSION: Pt shows AROM on L LE symmetrical to RLE. His strength looks pretty good on L ankle. He reports mild pain with ankle iversion, so we focused strengthening on this movement. He is able to do SLS for 30 seconds with mild ankle sways noted. He shows to be progressing with PT pretty well and reports he feels that he could wrap up real soon. Good response to treatment. Dakota Eledge continues to demonstrate potential for improvement and would benefit from continued skilled therapy to address impairments.     OBJECTIVE IMPAIRMENTS: Abnormal gait, decreased activity tolerance, decreased balance, decreased endurance, decreased mobility, difficulty walking, decreased ROM, decreased strength, increased edema, increased muscle spasms, and pain.   ACTIVITY LIMITATIONS: standing, squatting, stairs, and locomotion level  PARTICIPATION LIMITATIONS: meal prep, cleaning, laundry, shopping, community activity, occupation, and yard work  PERSONAL FACTORS: Past/current experiences, Time since onset of injury/illness/exacerbation, and 1-2 comorbidities: previous ankle sprain, smoker  are also affecting patient's functional outcome.   REHAB POTENTIAL: Good  CLINICAL DECISION MAKING: Evolving/moderate complexity  EVALUATION COMPLEXITY: Low   GOALS: Goals reviewed with patient? Yes  SHORT TERM GOALS: Target date: 03/07/2023   Patient will be independent with initial HEP. Baseline: needs  Goal status: IN PROGRESS 03/23/23- MET  LONG TERM GOALS: Target date: 05/02/2023   Patient will be independent with advanced/ongoing HEP to improve outcomes and carryover.  Baseline:  Goal status: IN PROGRESS  2.  Patient  will report at least 75% improvement in Left ankle/foot pain to improve QOL. Baseline:  Goal status: MET- pt denies any pain 03/30/23  3.  Patient will demonstrate improved left ankle AROM to WNL to allow for normal gait and stair mechanics. Baseline: see objective Goal status: IN PROGRESS- 04/13/23  4.  Patient will demonstrate improved functional LE strength as demonstrated by 5/5 L ankle strength. Baseline: see objective Goal status: IN PROGRESS- 04/13/23  5.  Patient will be able to ascend/descend stairs with reciprocal step pattern safely to access home and community.  Baseline: step to Goal status: IN PROGRESS  6.  Patient will report at least 9 points improvement on LEFS to demonstrate improved functional ability. Baseline: 39/80 Goal status: IN PROGRESS  PLAN:  PT FREQUENCY: 1-2x/week  PT DURATION: 12 weeks (extended due to holiday, patient going out of town, and healing time for recent injury)  PLANNED INTERVENTIONS: 97110-Therapeutic exercises, 97530- Therapeutic activity, [id number]- Neuromuscular re-education, 97535- Self Care, 02725- Manual therapy, [id number]- Gait training, [id number]- Orthotic Fit/training, [id number]- Splinting, 97014- Electrical stimulation (unattended), [id number]- Electrical stimulation (manual), 97016- Vasopneumatic device, [id number]- Ionotophoresis 4mg /ml Dexamethasone, Patient/Family education, Balance training, Stair training, Taping, Dry Needling, Joint mobilization, Joint manipulation, Cryotherapy, and Moist heat  PLAN FOR NEXT SESSION: prepare for discharge; WB as tolerated, focus on balance, proprioception and strengthening.    Darleene Cleaver, PTA  04/13/2023, 8:46 AM

## 2023-04-17 ENCOUNTER — Ambulatory Visit: Payer: Commercial Managed Care - HMO

## 2023-04-17 DIAGNOSIS — M25572 Pain in left ankle and joints of left foot: Secondary | ICD-10-CM

## 2023-04-17 DIAGNOSIS — R600 Localized edema: Secondary | ICD-10-CM

## 2023-04-17 DIAGNOSIS — M25672 Stiffness of left ankle, not elsewhere classified: Secondary | ICD-10-CM

## 2023-04-17 NOTE — Therapy (Addendum)
 OUTPATIENT PHYSICAL THERAPY LOWER EXTREMITY TREATMENT/ Discharge Summary   Patient Name: Dakota Wolf MRN: [id number] DOB:27-Jan-1991, 33 y.o., male Today's Date: 04/17/2023  END OF SESSION:  PT End of Session - 04/17/23 0818     Visit Number 9    Date for PT Re-Evaluation 05/02/23    Authorization Type [employer]    Authorization Time Period No auth needed    Progress Note Due on Visit 10    PT Start Time 0800    PT Stop Time 0818    PT Time Calculation (min) 18 min    Activity Tolerance Patient tolerated treatment well    Behavior During Therapy [facility] for tasks assessed/performed                  Past Medical History:  Diagnosis Date   Hypertension    Past Surgical History:  Procedure Laterality Date   ENDOVENOUS ABLATION SAPHENOUS VEIN W/ LASER Left 07/14/2022   endovenous laser ablation left greater saphenous vein and stab phlebectomy >20 incisions left leg by Lemar Livings MD   Patient Active Problem List   Diagnosis Date Noted   Morbid obesity (HCC) 03/02/2023   Fracture of navicular bone of left foot 01/11/2023   Obstructive sleep apnea 11/06/2022   Hypogonadism in male 07/10/2022   Well adult exam 06/07/2022   Screening for HIV (human immunodeficiency virus) 09/06/2021   Varicose veins of left lower extremity with pain 09/06/2021   Lumbar radiculitis 05/27/2021   Essential hypertension 05/26/2020   Daytime somnolence 05/26/2020   Possible exposure to STD 05/26/2020    PCP: Everrett Coombe, DO   REFERRING PROVIDER: Monica Becton  REFERRING DIAG: [phone] (ICD-10-CM) - Chronic pain of left ankle   THERAPY DIAG:  Pain in left ankle and joints of left foot  Stiffness of left ankle, not elsewhere classified  Localized edema  Rationale for Evaluation and Treatment: Rehabilitation  ONSET DATE: October 2024  SUBJECTIVE:   SUBJECTIVE STATEMENT: NO new issues  PERTINENT HISTORY: HTN, smoker PAIN:  Are you having  pain? Yes: NPRS scale: 0/10 Pain location: L ankle Pain description: sharp Aggravating factors: stairs, Relieving factors: ice, elevation, rest, meloxicam  PRECAUTIONS: None  RED FLAGS: None   WEIGHT BEARING RESTRICTIONS: No  FALLS:  Has patient fallen in last 6 months? Yes. 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 Benjamin Stain  OBJECTIVE:  Note: Objective measures were completed at Evaluation unless otherwise noted.  DIAGNOSTIC FINDINGS: 01/11/23 DG Ankle IMPRESSION: 1. No acute fracture or dislocation. 2. Mild-to-moderate diffuse soft tissue swelling of the visualized calf and ankle.  PATIENT SURVEYS:  LEFS 39/80  COGNITION: Overall cognitive status: Within functional limits for tasks assessed     SENSATION: WFL  EDEMA:  Circumferential: R 64 cm,Left 68 cm  03/27/23- no change.  POSTURE: No Significant postural limitations  PALPATION: Tenderness over Left  Anterior talofibular ligament, calcaneofibular ligament, and posterior talofibular ligament.  CFL most tender.    LOWER EXTREMITY MMT:   MMT Right eval Left * eval L 04/13/23  Ankle dorsiflexion 5  5  Ankle plantarflexion 5  5  Ankle inversion 5 2 4+  Ankle eversion 5 2 5    (Blank rows = not tested) *  based on active movement, no resisted movement due to possible new injury  LOWER EXTREMITY ROM:  PROM Right eval Left eval L AROM 04/13/23  Ankle dorsiflexion 10 -15* 5  Ankle plantarflexion     Ankle inversion 40 15 19  Ankle eversion 35 10 26   (Blank rows = not tested)  LOWER EXTREMITY SPECIAL TESTS:  Ankle special tests: Anterior drawer test: negative  GAIT: Distance walked: 67' Assistive device  utilized: None Level of assistance: Complete Independence Comments: in CAM boot on L, antalgic   TODAY'S TREATMENT:                                                                                                                              DATE:  04/17/23 Therapeutic Exercise: to improve strength and mobility.  Demo, verbal and tactile cues throughout for technique. Bike L4 x 6 min Assessed stair navigation: WFL ascending and descending reciprocal gait pattern LEFS: 80/80   04/13/23 Therapeutic Exercise: to improve strength and mobility.  Demo, verbal and tactile cues throughout for technique. Bike L3 x 5 min AROM and strength assessed S/L EV 4lb weight x 20 each S/L IV 4lb weight x 20 each Seated isometric ankle IV with ball 5x10 SLS L 2x30"  04/06/23 Therapeutic Exercise: to improve strength and mobility.  Demo, verbal and tactile cues throughout for technique. Bike L 3 x 5 min Single leg RDL 2 x 10 bil Seated DF with 3lb weight x 20  S/L EV and IV 3lb weight x 20 each Single leg heel raise x 10 B BAPS levels 3 2.5lb PF/DF/EV/IV  Neuromuscular Reeducation: to improve balance and stability. SBA for safety throughout.  Marches standing on airex x 10 B Standing hip abduction x 10 B on airex Standing pallof press blue TB with SLS x 10 Standing oscillations with blue TB x 10 SLS- lateral  04/03/2023 Therapeutic Exercise: to improve strength and mobility.  Demo, verbal and tactile cues throughout for technique. Bike L 3 x 5 min  Church pews on airex 2 x 20 BAPS board Level 3 (seated) DF/PF 3 x 20 (last 2 sets with 2# weights ant & post), Inv/Ever 3 x 20 (added 2# weights AL and AM), CW& CCW circles (2# weighs AL & AM) Toe raises against wall 2 x 20 Squats on bosu (flat side) x 20  Neuromuscular Reeducation: to improve balance and stability. SBA for safety throughout.  Star excursion x 10 each side (cardinal points) Forward taps on 2" step x 10 each side Touch back squats on 2"  step x 10 each side M/L weight shifts on slant board x 20 F/B weight shifts on slant board x 20 Single leg RDLs  x 10 each side  03/30/23 Therapeutic Exercise: to improve strength and mobility.  Demo, verbal and tactile cues throughout for technique. Nustep L5x70min LE only BAPS board level 2- all directions x 20; added 2.5 weight AL and AM for EV/IV Slant  board PF/DF/EV/IV x 20 Seated blue TB PF x 20  Seated ankle DF/EV/IV x 20 GTB Seated isometric heel raise into BOSU ball  Manual Therapy: to decrease muscle spasm and pain and improve mobility Manual edema resorption and retrograde massage to L ankle  03/27/23 Therapeutic Exercise: to improve strength and mobility.  Demo, verbal and tactile cues throughout for technique. Bike L3 x 6 min  BAPS board seated - Level 2 - DF/PF x 20, Ever/INV x 20, CW/CCW x 10 Seated ankle raises x 20 Seated resisted great toe presses 2 x 20 GTB Seated ankle DF GTB 3 x 20 Seated ankle Eversion GTB 3 x 20 Seated ankle Inversion GTB 3 x 20  Seated ankle PF GTB 3 x 20 Manual Therapy: to decrease muscle spasm and pain and improve mobility Manual edema resorption techniques to     PATIENT EDUCATION:  Education details: HEP update Person educated: Patient Education method: Explanation, Demonstration, and Handouts Education comprehension: verbalized understanding and returned demonstration  HOME EXERCISE PROGRAM: Access Code: [email or web address] URL: [email or web address] Date: 04/03/2023 Prepared by: Harrie Foreman  Exercises - Long Sitting Calf Stretch with Strap  - 1 x daily - 7 x weekly - 3 sets - 10 reps - Seated Toe Raise  - 1 x daily - 7 x weekly - 3 sets - 10 reps - Towel Scrunches  - 1 x daily - 7 x weekly - 3 sets - 10 reps - Toe Spreading  - 1 x daily - 7 x weekly - 3 sets - 10 reps - Seated Ankle Alphabet  - 1 x daily - 7 x weekly - 1 sets - 1 reps - Toe Yoga - Alternating Great Toe and Lesser Toe Extension  - 1 x daily - 7 x  weekly - 3 sets - 10 reps - Seated Ankle Inversion with Resistance and Legs Crossed  - 1 x daily - 7 x weekly - 3 sets - 20 reps - Seated Ankle Plantarflexion with Resistance  - 1 x daily - 7 x weekly - 3 sets - 20 reps - Long Sitting Ankle Eversion with Resistance  - 1 x daily - 7 x weekly - 3 sets - 20 reps - Long Sitting Ankle Dorsiflexion with Anchored Resistance  - 1 x daily - 7 x weekly - 3 sets - 20 reps - Toe Raise With Back Against Wall  - 1 x daily - 7 x weekly - 3 sets - 10 reps  ASSESSMENT:  CLINICAL IMPRESSION: Dakota has shown great progress with PT. Today assessed stair navigation and LEFS. He has met goals for both of these (LTG #5 and #6). He has also met LTG #3 for ankle ROM WNL last visit. The only goal he has not met is LTG #4 for L ankle strength 5/5 grossly. Last visit he reported his L ankle felt like he could return to playing football. Pt would like to go on 30 day hold at this time from PT d/t his progress.   OBJECTIVE IMPAIRMENTS: Abnormal gait, decreased activity tolerance, decreased balance, decreased endurance, decreased mobility, difficulty walking, decreased ROM, decreased strength, increased edema, increased muscle spasms, and pain.   ACTIVITY LIMITATIONS: standing, squatting, stairs, and locomotion level  PARTICIPATION LIMITATIONS: meal prep, cleaning, laundry, shopping, community activity, occupation, and yard work  PERSONAL FACTORS: Past/current experiences, Time since onset of injury/illness/exacerbation, and 1-2 comorbidities: previous ankle sprain, smoker  are also affecting patient's functional outcome.   REHAB POTENTIAL: Good  CLINICAL DECISION MAKING: Evolving/moderate complexity  EVALUATION COMPLEXITY: Low   GOALS: Goals reviewed with patient? Yes  SHORT TERM GOALS: Target date: 03/07/2023   Patient will be independent with initial HEP. Baseline: needs  Goal status: MET 03/23/23- MET  LONG TERM GOALS: Target date: 05/02/2023   Patient will  be independent with advanced/ongoing HEP to improve outcomes and carryover.  Baseline:  Goal status: MET  2.  Patient will report at least 75% improvement in Left ankle/foot pain to improve QOL. Baseline:  Goal status: MET- pt denies any pain 03/30/23  3.  Patient will demonstrate improved left ankle AROM to WNL to allow for normal gait and stair mechanics. Baseline: see objective Goal status: MET- 04/13/23- WNL for patient  4.  Patient will demonstrate improved functional LE strength as demonstrated by 5/5 L ankle strength. Baseline: see objective Goal status: PARTIALLY MET- 04/13/23- inversion 4+/5  5.  Patient will be able to ascend/descend stairs with reciprocal step pattern safely to access home and community.  Baseline: step to Goal status: MET- 04/17/23 see under treatment  6.  Patient will report at least 9 points improvement on LEFS to demonstrate improved functional ability. Baseline: 39/80 Goal status: MET- 04/17/23  PLAN:  PT FREQUENCY: 1-2x/week  PT DURATION: 12 weeks (extended due to holiday, patient going out of town, and healing time for recent injury)  PLANNED INTERVENTIONS: 97110-Therapeutic exercises, 97530- Therapeutic activity, [id number]- Neuromuscular re-education, [phone]- Self Care, 60454- Manual therapy, [id number]- Gait training, [id number]- Orthotic Fit/training, [id number]- Splinting, 97014- Electrical stimulation (unattended), [id number]- Electrical stimulation (manual), 97016- Vasopneumatic device, 97033- Ionotophoresis 4mg /ml Dexamethasone, Patient/Family education, Balance training, Stair training, Taping, Dry Needling, Joint mobilization, Joint manipulation, Cryotherapy, and Moist heat  PLAN FOR NEXT SESSION: 30 day hold   Kerston Landeck L Jobeth Pangilinan, PTA  04/17/2023, 8:18 AM   PHYSICAL THERAPY DISCHARGE SUMMARY  Visits from Start of Care: 9  Current functional level related to goals / functional outcomes: See above.  Met or almost met all goals, able to walk without pain or AD. LEFS  improved from 39/80 to 80/80.    Remaining deficits: Slight weakness ankle inversion 4+/5    Education / Equipment: HEP  Plan: Patient agrees to discharge.  Patient is being discharged due to meeting the stated rehab goals. Refer to above clinical impression and goal assessment for status as of last visit on 04/17/23. Patient was placed on hold for 30 days and has not needed to return to PT, therefore will proceed with discharge from PT for this episode.      Jena Gauss, PT  05/18/2023 1:06 PM

## 2023-04-24 ENCOUNTER — Encounter: Payer: Managed Care, Other (non HMO) | Admitting: Physical Therapy

## 2023-04-26 ENCOUNTER — Ambulatory Visit
Admission: EM | Admit: 2023-04-26 | Discharge: 2023-04-26 | Disposition: A | Discharge status: Home / Self Care | Payer: Medicaid Other | Attending: Family Medicine | Admitting: Family Medicine

## 2023-04-26 DIAGNOSIS — R059 Cough, unspecified: Secondary | ICD-10-CM

## 2023-04-26 DIAGNOSIS — R6889 Other general symptoms and signs: Secondary | ICD-10-CM

## 2023-04-26 DIAGNOSIS — R509 Fever, unspecified: Secondary | ICD-10-CM

## 2023-04-26 DIAGNOSIS — J101 Influenza due to other identified influenza virus with other respiratory manifestations: Secondary | ICD-10-CM

## 2023-04-26 LAB — POC COVID19/FLU A&B COMBO
Covid Antigen, POC: NEGATIVE
Influenza A Antigen, POC: POSITIVE — AB
Influenza B Antigen, POC: NEGATIVE

## 2023-04-26 MED ORDER — OSELTAMIVIR PHOSPHATE 75 MG PO CAPS: 75.0000 mg | ORAL_CAPSULE | Freq: Two times a day (BID) | ORAL | 0 refills | Status: AC

## 2023-04-26 MED ORDER — PROMETHAZINE-DM 6.25-15 MG/5ML PO SYRP: 5.0000 mL | ORAL_SOLUTION | Freq: Two times a day (BID) | ORAL | 0 refills | Status: AC | PRN

## 2023-04-26 MED ORDER — ACETAMINOPHEN 500 MG PO TABS
1000.0000 mg | ORAL_TABLET | ORAL | Status: AC
  Administered 2023-04-26: 1000 mg via ORAL

## 2023-04-26 MED ORDER — BENZONATATE 200 MG PO CAPS: 200.0000 mg | ORAL_CAPSULE | Freq: Three times a day (TID) | ORAL | 0 refills | Status: AC | PRN

## 2023-04-26 NOTE — ED Provider Notes (Signed)
[facility]    CSN: [id number] Arrival date & time: 04/26/23  7829      History   Chief Complaint Chief Complaint  Patient presents with   Generalized Body Aches   Fatigue   Cough   Fever    HPI Dakota Wolf is a 33 y.o. male.   HPI 33 year old male presents with generalized bodyaches, fatigue, cough, fever since yesterday.  PMH significant for morbid obesity and OSA.  Past Medical History:  Diagnosis Date   Hypertension     Patient Active Problem List   Diagnosis Date Noted   Morbid obesity (HCC) 03/02/2023   Fracture of navicular bone of left foot 01/11/2023   Obstructive sleep apnea 11/06/2022   Hypogonadism in male 07/10/2022   Well adult exam 06/07/2022   Screening for HIV (human immunodeficiency virus) 09/06/2021   Varicose veins of left lower extremity with pain 09/06/2021   Lumbar radiculitis 05/27/2021   Essential hypertension 05/26/2020   Daytime somnolence 05/26/2020   Possible exposure to STD 05/26/2020    Past Surgical History:  Procedure Laterality Date   ENDOVENOUS ABLATION SAPHENOUS VEIN W/ LASER Left 07/14/2022   endovenous laser ablation left greater saphenous vein and stab phlebectomy >20 incisions left leg by Lemar Livings MD       Home Medications    Prior to Admission medications   Medication Sig Start Date End Date Taking? Authorizing Provider  benzonatate (TESSALON) 200 MG capsule Take 1 capsule (200 mg total) by mouth 3 (three) times daily as needed for up to 7 days. 04/26/23 05/03/23 Yes Trevor Iha, FNP  oseltamivir (TAMIFLU) 75 MG capsule Take 1 capsule (75 mg total) by mouth every 12 (twelve) hours. 04/26/23  Yes Trevor Iha, FNP  promethazine-dextromethorphan (PROMETHAZINE-DM) 6.25-15 MG/5ML syrup Take 5 mLs by mouth 2 (two) times daily as needed for cough. 04/26/23  Yes Trevor Iha, FNP  AMBULATORY NON FORMULARY MEDICATION Please provide AutoPap with setting 4-20cmH20 Mask per patient preference.  Please  provide headgear for mask dispensed, heated tubing, reservoir/humidifier, filters and any other accesories needed for machine.  Please send compliance data after 60 days. 11/01/22   Everrett Coombe, DO  LORazepam (ATIVAN) 1 MG tablet Take 2 tablets 30 minutes prior to leaving house on day of office surgery.  Bring third tablet with you to office on day of office surgery. 07/07/22   Maeola Harman, MD  meloxicam (MOBIC) 15 MG tablet Take 1 tablet (15 mg total) by mouth daily. 03/01/23   Monica Becton, MD  traMADol (ULTRAM) 50 MG tablet Take 1 tablet (50 mg total) by mouth every 8 (eight) hours as needed for moderate pain (pain score 4-6). 03/01/23   Monica Becton, MD  valsartan (DIOVAN) 160 MG tablet Take 1 tablet (160 mg total) by mouth daily. 11/01/22   Everrett Coombe, DO    Family History Family History  Problem Relation Age of Onset   Hypertension Father    Other Maternal Uncle    Hypertension Maternal Grandmother    Other Cousin     Social History Social History   Tobacco Use   Smoking status: Light Smoker    Types: Cigars   Smokeless tobacco: Never   Tobacco comments:    07/02/20 occas cigar  Vaping Use   Vaping status: Some Days   Substances: THC  Substance Use Topics   Alcohol use: Yes    Alcohol/week: 2.0 - 3.0 standard drinks of alcohol    Types: 2 - 3 Cans  of beer per week   Drug use: No     Allergies   Patient has no known allergies.   Review of Systems Review of Systems   Physical Exam Triage Vital Signs ED Triage Vitals [04/26/23 0821]  Encounter Vitals Group     BP (!) 143/83     Systolic BP Percentile      Diastolic BP Percentile      Pulse Rate (!) 112     Resp 17     Temp (!) 101.9 F (38.8 C)     Temp Source Oral     SpO2 96 %     Weight      Height      Head Circumference      Peak Flow      Pain Score 10     Pain Loc      Pain Education      Exclude from Growth Chart    No data found.  Updated Vital Signs BP  (!) 143/83 (BP Location: Right Arm)   Pulse (!) 112   Temp (!) 101.9 F (38.8 C) (Oral)   Resp 17   SpO2 96%    Physical Exam Vitals and nursing note reviewed.  Constitutional:      Appearance: Normal appearance. He is obese. He is ill-appearing.  HENT:     Head: Normocephalic and atraumatic.     Right Ear: Tympanic membrane, ear canal and external ear normal.     Left Ear: Tympanic membrane, ear canal and external ear normal.     Nose: Nose normal.     Mouth/Throat:     Mouth: Mucous membranes are moist.     Pharynx: Oropharynx is clear.  Eyes:     Conjunctiva/sclera: Conjunctivae normal.     Pupils: Pupils are equal, round, and reactive to light.  Cardiovascular:     Rate and Rhythm: Normal rate and regular rhythm.     Pulses: Normal pulses.     Heart sounds: Normal heart sounds.  Pulmonary:     Effort: Pulmonary effort is normal.     Breath sounds: Normal breath sounds. No wheezing, rhonchi or rales.     Comments: infrequent nonproductive cough on exam Musculoskeletal:        General: Normal range of motion.     Cervical back: Normal range of motion and neck supple.  Skin:    General: Skin is warm and dry.  Neurological:     General: No focal deficit present.     Mental Status: He is oriented to person, place, and time. Mental status is at baseline.  Psychiatric:        Mood and Affect: Mood normal.        Behavior: Behavior normal.      UC Treatments / Results  Labs (all labs ordered are listed, but only abnormal results are displayed) Labs Reviewed  POC COVID19/FLU A&B COMBO - Abnormal; Notable for the following components:      Result Value   Influenza A Antigen, POC Positive (*)    All other components within normal limits    EKG   Radiology No results found.  Procedures Procedures (including critical care time)  Medications Ordered in UC Medications  acetaminophen (TYLENOL) tablet 1,000 mg (1,000 mg Oral Given 04/26/23 0829)    Initial  Impression / Assessment and Plan / UC Course  I have reviewed the triage vital signs and the nursing notes.  Pertinent labs & imaging results that were  available during my care of the patient were reviewed by me and considered in my medical decision making (see chart for details).     MDM: 1.  Influenza A-Rx'd Tamiflu 75 mg capsule: Take 1 capsule twice daily x 7 days; 2.  Fever, unspecified type-Advised patient may take OTC Tylenol 1 g every 6 hours for fever (oral temperature greater than 100.3).  3.  Cough, unspecified type, Rx'd Tessalon 200 mg capsule: Take 1 capsule 3 times daily, as needed for cough, Rx'd promethazine DM 6.25-15 mg/5 mL syrup: Take 5 mL every 6 hours, as needed for cough. Advised patient to take medication as directed with food to completion.  Advised may take Tessalon capsules daily or as needed for cough.  Advised may use Promethazine DM for cough at night prior to sleep due to sedative effects.  Advised patient may take OTC Tylenol 1 g every 6 hours for fever (oral temperature greater than 100.3).  Encouraged to increase daily water intake to 64 ounces per day while taking these medications.  Patient discharged home, hemodynamically stable, work note provided to patient prior to discharge per request. Final Clinical Impressions(s) / UC Diagnoses   Final diagnoses:  Fever, unspecified  Influenza A  Cough, unspecified type     Discharge Instructions      Advised patient to take medication as directed with food to completion.  Advised may take Tessalon capsules daily or as needed for cough.  Advised may use Promethazine DM for cough at night prior to sleep due to sedative effects.  Advised patient may take OTC Tylenol 1 g every 6 hours for fever (oral temperature greater than 100.3).  Encouraged to increase daily water intake to 64 ounces per day while taking these medications.     ED Prescriptions     Medication Sig Dispense Auth. Provider   oseltamivir (TAMIFLU)  75 MG capsule Take 1 capsule (75 mg total) by mouth every 12 (twelve) hours. 10 capsule Trevor Iha, FNP   benzonatate (TESSALON) 200 MG capsule Take 1 capsule (200 mg total) by mouth 3 (three) times daily as needed for up to 7 days. 40 capsule Trevor Iha, FNP   promethazine-dextromethorphan (PROMETHAZINE-DM) 6.25-15 MG/5ML syrup Take 5 mLs by mouth 2 (two) times daily as needed for cough. 118 mL Trevor Iha, FNP      PDMP not reviewed this encounter.   Trevor Iha, FNP 04/26/23 [phone]

## 2023-04-26 NOTE — ED Triage Notes (Signed)
Pt c/o cough, runny nose, bodyaches since yesterday. Taking tylenol and dayquil prn.

## 2023-04-26 NOTE — Discharge Instructions (Addendum)
Advised patient to take medication as directed with food to completion.  Advised may take Tessalon capsules daily or as needed for cough.  Advised may use Promethazine DM for cough at night prior to sleep due to sedative effects.  Advised patient may take OTC Tylenol 1 g every 6 hours for fever (oral temperature greater than 100.3).  Encouraged to increase daily water intake to 64 ounces per day while taking these medications.

## 2023-09-04 ENCOUNTER — Ambulatory Visit: Payer: Commercial Managed Care - HMO | Admitting: Family Medicine

## 2023-09-12 ENCOUNTER — Ambulatory Visit: Payer: Medicaid Other | Admitting: Family Medicine

## 2023-09-28 ENCOUNTER — Ambulatory Visit: Admitting: Family Medicine

## 2023-09-28 ENCOUNTER — Telehealth: Payer: Self-pay

## 2023-09-28 ENCOUNTER — Encounter: Payer: Self-pay | Admitting: Family Medicine

## 2023-09-28 VITALS — BP 134/83 | HR 70 | Ht 78.0 in | Wt 369.0 lb

## 2023-09-28 DIAGNOSIS — S92255D Nondisplaced fracture of navicular [scaphoid] of left foot, subsequent encounter for fracture with routine healing: Secondary | ICD-10-CM

## 2023-09-28 DIAGNOSIS — Z13220 Encounter for screening for lipoid disorders: Secondary | ICD-10-CM

## 2023-09-28 DIAGNOSIS — G4733 Obstructive sleep apnea (adult) (pediatric): Secondary | ICD-10-CM | POA: Diagnosis not present

## 2023-09-28 DIAGNOSIS — I10 Essential (primary) hypertension: Secondary | ICD-10-CM | POA: Diagnosis not present

## 2023-09-28 DIAGNOSIS — L309 Dermatitis, unspecified: Secondary | ICD-10-CM

## 2023-09-28 DIAGNOSIS — Z202 Contact with and (suspected) exposure to infections with a predominantly sexual mode of transmission: Secondary | ICD-10-CM

## 2023-09-28 DIAGNOSIS — M79672 Pain in left foot: Secondary | ICD-10-CM

## 2023-09-28 MED ORDER — ZEPBOUND 5 MG/0.5ML ~~LOC~~ SOAJ: 5.0000 mg | SUBCUTANEOUS | 0 refills | Status: DC

## 2023-09-28 MED ORDER — ZEPBOUND 7.5 MG/0.5ML ~~LOC~~ SOAJ: 7.5000 mg | SUBCUTANEOUS | 0 refills | Status: DC

## 2023-09-28 MED ORDER — TRIAMCINOLONE ACETONIDE 0.1 % EX CREA: 1.0000 | TOPICAL_CREAM | Freq: Two times a day (BID) | CUTANEOUS | 0 refills | Status: AC

## 2023-09-28 MED ORDER — ZEPBOUND 2.5 MG/0.5ML ~~LOC~~ SOAJ: 2.5000 mg | SUBCUTANEOUS | 0 refills | Status: DC

## 2023-09-28 NOTE — Assessment & Plan Note (Signed)
 He is not using CPAP at this time.  We discussed use of Zepbound for treatment of OSA and will start this.

## 2023-09-28 NOTE — Assessment & Plan Note (Signed)
 Rx for triamcinolone crea.

## 2023-09-28 NOTE — Progress Notes (Signed)
 Dakota Sigl - 33 y.o. male MRN [id number]  Date of birth: [birth year]  Subjective Chief Complaint  Patient presents with   Weight Loss   Foot Pain   Rash    HPI Dakota Wolf is a 33 y.o. male here today for follow up visit.   He reports that he is doing well.   He continues on valsartan  for management of HTN.  BP today is well controlled.  He has not had chest pain, shortness of breath, palpitations, headache or vision changes.    Continues to have pain in the L ankle.  He is having increased pain and feels that arch is starting to collapse.  He had seen sports medicine previously and did PT for several weeks.  Would be amenable to foot and ankle specialist at this time.   He is interested in trying [facility] for weight loss.  He has tried multiple means of trying to lose weight including calorie restriction, increased exercise and meal programs.  He has had some success but ultimately has not been able to keep the weight off.   He does have sleep apnea.  Orders for CPAP sent but he never got set up for this.   ROS:  A comprehensive ROS was completed and negative except as noted per HPI   No Known Allergies  Past Medical History:  Diagnosis Date   Hypertension     Past Surgical History:  Procedure Laterality Date   ENDOVENOUS ABLATION SAPHENOUS VEIN W/ LASER Left 07/14/2022   endovenous laser ablation left greater saphenous vein and stab phlebectomy >20 incisions left leg by [name]    Social History   Socioeconomic History   Marital status: Single    Spouse name: Not on file   Number of children: 0   Years of education: Not on file   Highest education level: Bachelor's degree (e.g., BA, AB, BS)  Occupational History   Not on file  Tobacco Use   Smoking status: Light Smoker    Types: Cigars   Smokeless tobacco: Never   Tobacco comments:    07/02/20 occas cigar  Vaping Use   Vaping status: Some Days   Substances: THC  Substance and Sexual Activity    Alcohol use: Yes    Alcohol/week: 2.0 - 3.0 standard drinks of alcohol    Types: 2 - 3 Cans of beer per week   Drug use: No   Sexual activity: Yes    Partners: Female    Birth control/protection: Condom  Other Topics Concern   Not on file  Social History Narrative   Caffeine- 20 oz coffee, 2 sodas daily   Social Drivers of Corporate investment banker Strain: Not on file  Food Insecurity: Not on file  Transportation Needs: Not on file  Physical Activity: Not on file  Stress: Not on file  Social Connections: Not on file    Family History  Problem Relation Age of Onset   Hypertension Father    Other Maternal Uncle    Hypertension Maternal Grandmother    Other Cousin     Health Maintenance  Topic Date Due   Hepatitis C Screening  Never done   Pneumococcal Vaccine 54-68 Years old (1 of 2 - PCV) Never done   Hepatitis B Vaccines (1 of 3 - 19+ 3-dose series) Never done   HPV VACCINES (1 - 3-dose SCDM series) Never done   COVID-19 Vaccine (1 - 2024-25 season) Never done   INFLUENZA VACCINE  10/27/2023  DTaP/Tdap/Td (2 - Td or Tdap) 11/27/2028   HIV Screening  Completed   Meningococcal B Vaccine  Aged Out     ----------------------------------------------------------------------------------------------------------------------------------------------------------------------------------------------------------------- Physical Exam BP 134/83 (BP Location: Left Arm, Patient Position: Sitting, Cuff Size: Large)   Pulse 70   Ht 6' 6 (1.981 m)   Wt (!) 369 lb (167.4 kg)   SpO2 98%   BMI 42.64 kg/m   Physical Exam Constitutional:      Appearance: Normal appearance.  Eyes:     General: No scleral icterus. Cardiovascular:     Rate and Rhythm: Normal rate and regular rhythm.  Pulmonary:     Effort: Pulmonary effort is normal.     Breath sounds: Normal breath sounds.  Musculoskeletal:     Comments: Collapse of arch on L foot with eversion of foot  Skin:    Comments:  Dry, scaly patch on L Leg.    Neurological:     General: No focal deficit present.     Mental Status: He is alert.  Psychiatric:        Mood and Affect: Mood normal.        Behavior: Behavior normal.     ------------------------------------------------------------------------------------------------------------------------------------------------------------------------------------------------------------------- Assessment and Plan  Essential hypertension Blood pressure is at goal at for age and co-morbidities.  I recommend continuation of diovan  at current strength.  In addition they were instructed to follow a low sodium diet with regular exercise to help to maintain adequate control of blood pressure.    Possible exposure to STD GC/Chlamydia, HIV and RPR ordered.   Fracture of navicular bone of left foot Continues to have pain, now with collapse of arch.  Referral to foot and ankle specialist.   Eczema Rx for triamcinolone crea.   Obstructive sleep apnea He is not using CPAP at this time.  We discussed use of Zepbound for treatment of OSA and will start this.     Meds ordered this encounter  Medications   tirzepatide (ZEPBOUND) 2.5 MG/0.5ML Pen    Sig: Inject 2.5 mg into the skin once a week.    Dispense:  2 mL    Refill:  0   tirzepatide (ZEPBOUND) 5 MG/0.5ML Pen    Sig: Inject 5 mg into the skin once a week.    Dispense:  2 mL    Refill:  0   tirzepatide (ZEPBOUND) 7.5 MG/0.5ML Pen    Sig: Inject 7.5 mg into the skin once a week.    Dispense:  2 mL    Refill:  0   triamcinolone cream (KENALOG) 0.1 %    Sig: Apply 1 Application topically 2 (two) times daily.    Dispense:  30 g    Refill:  0    No follow-ups on file.

## 2023-09-28 NOTE — Telephone Encounter (Signed)
 Pharmacy Patient Advocate Encounter   Received notification from CoverMyMeds that prior authorization for Zepbound 2.5MG /0.5ML pen-injectors is required/requested.   Insurance verification completed.   The patient is insured through [facility] .   Per test claim: PA required; PA submitted to above mentioned insurance via CoverMyMeds Key/confirmation #/EOC [id number] Status is pending

## 2023-09-28 NOTE — Assessment & Plan Note (Signed)
 Continues to have pain, now with collapse of arch.  Referral to foot and ankle specialist.

## 2023-09-28 NOTE — Assessment & Plan Note (Signed)
Blood pressure is at goal at for age and co-morbidities.  I recommend continuation of diovan at current strength.  In addition they were instructed to follow a low sodium diet with regular exercise to help to maintain adequate control of blood pressure.

## 2023-09-28 NOTE — Assessment & Plan Note (Signed)
GC/Chlamydia, HIV and RPR ordered.

## 2023-09-29 LAB — CMP14+EGFR
ALT: 76 IU/L — ABNORMAL HIGH (ref 0–44)
AST: 38 IU/L (ref 0–40)
Albumin: 4.6 g/dL (ref 4.1–5.1)
Alkaline Phosphatase: 94 IU/L (ref 44–121)
BUN/Creatinine Ratio: 13 (ref 9–20)
BUN: 14 mg/dL (ref 6–20)
Bilirubin Total: 1.1 mg/dL (ref 0.0–1.2)
CO2: 22 mmol/L (ref 20–29)
Calcium: 9.6 mg/dL (ref 8.7–10.2)
Chloride: 102 mmol/L (ref 96–106)
Creatinine, Ser: 1.05 mg/dL (ref 0.76–1.27)
Globulin, Total: 2.9 g/dL (ref 1.5–4.5)
Glucose: 87 mg/dL (ref 70–99)
Potassium: 4.6 mmol/L (ref 3.5–5.2)
Sodium: 140 mmol/L (ref 134–144)
Total Protein: 7.5 g/dL (ref 6.0–8.5)
eGFR: 96 mL/min/1.73 (ref >59)

## 2023-09-29 LAB — LIPID PANEL WITH LDL/HDL RATIO
Cholesterol, Total: 235 mg/dL — ABNORMAL HIGH (ref 100–199)
HDL: 32 mg/dL — ABNORMAL LOW (ref >39)
LDL Chol Calc (NIH): 175 mg/dL — ABNORMAL HIGH (ref 0–99)
LDL/HDL Ratio: 5.5 ratio — ABNORMAL HIGH (ref 0.0–3.6)
Triglycerides: 149 mg/dL (ref 0–149)
VLDL Cholesterol Cal: 28 mg/dL (ref 5–40)

## 2023-09-29 LAB — HEMOGLOBIN A1C
Est. average glucose Bld gHb Est-mCnc: 111 mg/dL
Hgb A1c MFr Bld: 5.5 % (ref 4.8–5.6)

## 2023-09-29 LAB — CBC WITH DIFFERENTIAL/PLATELET
Basophils Absolute: 0 x10E3/uL (ref 0.0–0.2)
Basos: 0 %
EOS (ABSOLUTE): 0.2 x10E3/uL (ref 0.0–0.4)
Eos: 3 %
Hematocrit: 48.2 % (ref 37.5–51.0)
Hemoglobin: 15.5 g/dL (ref 13.0–17.7)
Immature Grans (Abs): 0 x10E3/uL (ref 0.0–0.1)
Immature Granulocytes: 0 %
Lymphocytes Absolute: 1.7 x10E3/uL (ref 0.7–3.1)
Lymphs: 26 %
MCH: 30.5 pg (ref 26.6–33.0)
MCHC: 32.2 g/dL (ref 31.5–35.7)
MCV: 95 fL (ref 79–97)
Monocytes Absolute: 0.6 x10E3/uL (ref 0.1–0.9)
Monocytes: 8 %
Neutrophils Absolute: 4.2 x10E3/uL (ref 1.4–7.0)
Neutrophils: 63 %
Platelets: 248 x10E3/uL (ref 150–450)
RBC: 5.09 x10E6/uL (ref 4.14–5.80)
RDW: 12.4 % (ref 11.6–15.4)
WBC: 6.7 x10E3/uL (ref 3.4–10.8)

## 2023-09-29 LAB — HIV ANTIBODY (ROUTINE TESTING W REFLEX): HIV Screen 4th Generation wRfx: NONREACTIVE

## 2023-09-29 LAB — RPR: RPR Ser Ql: NONREACTIVE

## 2023-09-30 LAB — CHLAMYDIA/GONOCOCCUS/TRICHOMONAS, NAA
Chlamydia by NAA: NEGATIVE
Gonococcus by NAA: NEGATIVE
Trich vag by NAA: NEGATIVE

## 2023-10-01 ENCOUNTER — Ambulatory Visit: Payer: Self-pay | Admitting: Family Medicine

## 2023-10-02 NOTE — Telephone Encounter (Signed)
 Pharmacy Patient Advocate Encounter  Received notification from [employer] that Prior Authorization for  Zepbound  2.5MG /0.5ML pen-injectors  has been DENIED.  Full denial letter will be uploaded to the media tab. See denial reason below.   PA #/Case ID/Reference #: [id number]

## 2023-10-03 ENCOUNTER — Other Ambulatory Visit (HOSPITAL_COMMUNITY): Payer: Self-pay

## 2023-10-05 NOTE — Telephone Encounter (Signed)
 Please review the previous TE from CPhT [place] regarding the [facility] rx.

## 2023-10-10 ENCOUNTER — Other Ambulatory Visit (HOSPITAL_COMMUNITY): Payer: Self-pay

## 2023-10-10 NOTE — Telephone Encounter (Signed)
 Please review the provider's note regarding the medical necessity for Zepbound . Thanks in advance.

## 2023-10-11 ENCOUNTER — Telehealth: Payer: Self-pay | Admitting: Pharmacist

## 2023-10-11 NOTE — Telephone Encounter (Signed)
 Appeal has been submitted for Zepbound . Will advise when response is received, please be advised that most companies may take 30 days to make a decision. Appeal letter and supporting documentation have been faxed to [phone] on 10/11/2023 @2 :35 pm.  Thank you, Devere Pandy, PharmD Clinical Pharmacist  [place]  Direct Dial: [phone]

## 2023-10-16 ENCOUNTER — Encounter: Payer: Self-pay | Admitting: Family Medicine

## 2023-10-19 ENCOUNTER — Other Ambulatory Visit: Payer: Self-pay

## 2023-10-19 ENCOUNTER — Ambulatory Visit: Admitting: Physician Assistant

## 2023-10-19 DIAGNOSIS — M79672 Pain in left foot: Secondary | ICD-10-CM | POA: Diagnosis not present

## 2023-10-19 DIAGNOSIS — M76822 Posterior tibial tendinitis, left leg: Secondary | ICD-10-CM | POA: Diagnosis not present

## 2023-10-20 ENCOUNTER — Encounter: Payer: Self-pay | Admitting: Physician Assistant

## 2023-10-20 NOTE — Progress Notes (Addendum)
 Office Visit Note   Patient: Dakota Wolf           Date of Birth: [birth year]           MRN: [id number] Visit Date: 10/19/2023              Requested by: [name], DO [address] [facility] [address]  [address] [place],  [place] [address] PCP: [name], DO  Chief Complaint  Patient presents with   Left Foot - Pain      HPI: 33 y/o male presents with left ankle and foot pain.  He has a history of closed navicular fracture on 01/11/23.  He states he has had progressive arch pain and his ankle seems to be turning in.  He is otherwise healthy with no new injury.    Assessment & Plan: Visit Diagnoses:  1. 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  Supportive shoe wear hiking/trail shoe.    Follow-Up Instructions: Return if symptoms worsen or fail to improve.   Ortho Exam  Patient is alert, oriented, no adenopathy, well-dressed, normal affect, normal respiratory effort. No edema or cellulitis.  Valgus ankle/loss of arch.  He is able to stand on his toes B. Reconstitutes arch with toe raise.    He has posterior tibial tendon insufficiency.  Slight tenderness along the PT tendon.  Palpable pedal pulses.  Medial column is well aligned, no valgus of forefoot    Imaging: X ray demonstrated dorsal navicular spurring, no new fracture.  Proper join alignment.  Labs: Lab Results  Component Value Date   HGBA1C 5.5 09/28/2023     Lab Results  Component Value Date   ALBUMIN 4.6 09/28/2023    No results found for: MG No results found for: VD25OH  No results found for: PREALBUMIN    Latest Ref Rng & Units 09/28/2023    9:01 AM 06/10/2022   10:37 AM 09/06/2021   12:00 AM  CBC EXTENDED  WBC 3.4 - 10.8 x10E3/uL 6.7  6.7  6.2   RBC 4.14 - 5.80 x10E6/uL 5.09  5.14  5.02   Hemoglobin 13.0 - 17.7 g/dL 84.4  84.0  84.5   HCT 37.5 - 51.0 % 48.2  46.6  46.1   Platelets 150 - 450 x10E3/uL 248  236  217    NEUT# 1.4 - 7.0 x10E3/uL 4.2  4,603  3,949   Lymph# 0.7 - 3.1 x10E3/uL 1.7  1,481  1,606      There is no height or weight on file to calculate BMI.  Orders:  Orders Placed This Encounter  Procedures   XR Foot Complete Left   Ambulatory referral to Physical Therapy   No orders of the defined types were placed in this encounter.    Procedures: No procedures performed  Clinical Data: No additional findings.  ROS:  All other systems negative, except as noted in the HPI. Review of Systems  Objective: Vital Signs: There were no vitals taken for this visit.  Specialty Comments:  No specialty comments available.  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   Lumbar radiculitis 05/27/2021   Essential hypertension 05/26/2020   Daytime somnolence 05/26/2020   Possible exposure to STD 05/26/2020   Past Medical History:  Diagnosis Date   Hypertension     Family History  Problem Relation Age of Onset   Hypertension Father    Other Maternal Uncle    Hypertension Maternal Grandmother    Other Cousin     Past Surgical History:  Procedure Laterality Date   ENDOVENOUS ABLATION SAPHENOUS VEIN W/ LASER Left 07/14/2022   endovenous laser ablation left greater saphenous vein and stab phlebectomy >20 incisions left leg by [name]   Social History   Occupational History   Not on file  Tobacco Use   Smoking status: Light Smoker    Types: Cigars   Smokeless tobacco: Never   Tobacco comments:    07/02/20 occas cigar  Vaping Use   Vaping status: Some Days   Substances: THC  Substance and Sexual Activity   Alcohol  use: Yes    Alcohol/week: 2.0 - 3.0 standard drinks of alcohol    Types: 2 - 3 Cans of beer per week   Drug use: No   Sexual activity: Yes    Partners: Female    Birth control/protection: Condom

## 2023-11-01 ENCOUNTER — Other Ambulatory Visit (HOSPITAL_COMMUNITY): Payer: Self-pay

## 2023-11-07 ENCOUNTER — Other Ambulatory Visit (HOSPITAL_COMMUNITY): Payer: Self-pay

## 2023-11-07 NOTE — Telephone Encounter (Signed)
 Based on test claim, Zepbound  was approved by the insurance:

## 2023-11-07 NOTE — Therapy (Addendum)
 OUTPATIENT PHYSICAL THERAPY LOWER EXTREMITY EVALUATION   Patient Name: Dakota Wolf MRN: [id number] DOB:09-11-1990, 33 y.o., male Today's Date: 11/08/2023   END OF SESSION:  PT End of Session - 11/08/23 0848     Visit Number 1    Date for PT Re-Evaluation 01/03/24    Authorization Type [employer]    PT Start Time 0800    PT Stop Time 0847    PT Time Calculation (min) 47 min    Activity Tolerance Patient tolerated treatment well    Behavior During Therapy [facility] for tasks assessed/performed          Past Medical History:  Diagnosis Date   Hypertension    Past Surgical History:  Procedure Laterality Date   ENDOVENOUS ABLATION SAPHENOUS VEIN W/ LASER Left 07/14/2022   endovenous laser ablation left greater saphenous vein and stab phlebectomy >20 incisions left leg by [name]   Patient Active Problem List   Diagnosis Date Noted   Eczema 09/28/2023   Morbid obesity (HCC) 03/02/2023   Fracture of navicular bone of left foot 01/11/2023   Obstructive sleep apnea 11/06/2022   Hypogonadism in male 07/10/2022   Well adult exam 06/07/2022   Screening for HIV (human immunodeficiency virus) 09/06/2021   Varicose veins of left lower extremity with pain 09/06/2021   Lumbar radiculitis 05/27/2021   Essential hypertension 05/26/2020   Daytime somnolence 05/26/2020   Possible exposure to STD 05/26/2020    PCP: [name], DO   REFERRING PROVIDER: [name], FNP   REFERRING DIAG: [phone] (ICD-10-CM) - Insufficiency of left posterior tibial tendon  THERAPY DIAG:  Pain in left ankle and joints of left foot  Muscle weakness (generalized)  Difficulty in walking, not elsewhere classified  RATIONALE FOR EVALUATION AND TREATMENT: Rehabilitation  ONSET DATE: 11/24   NEXT MD VISIT:    SUBJECTIVE:                                                                                                                                                                                                          SUBJECTIVE STATEMENT:  Patient is a 33 y/o referred to PT for L posterior tibialis insufficiency from [facility].   He has a long h/o chronic L foot and ankle pain and injuries.  Came to PT here from 11/24 - 1/25 following a L navicular fx.   MRI 11/24 showed multiple problems (see report below).  HE did well with PT earlier this year and regained good strength and pain relief except for residual weakness of the  L posterior tibialis.  However, over last few months he has noticed his L arch collapsing progressively worse and having pain around the medial malloelus to the navicular area.  He f/u with podiatry and was advised to get SOLE inserts and come for PT.   His last office note from Dr Harden indicates that there is no new navicular fx but he does have some navicular spurring.  States pain is variable and some days doesn't hurt as much as others.  He can't relate the changes in pain to any change in his activity level since he works the same job daily.   However, pain is always worse as the day progresses.   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  He used to play arena football in past.  PAIN: Are you having pain? Yes: NPRS scale: 1/10 now; 8/10 worst Pain location: L ankle Pain description: achy, other times sharp Aggravating factors: as the day progresses  Relieving factors: sitting down, NWB positions  PERTINENT HISTORY:  HTN, L navicular fx, endovenous ablation of L saphenous vein, obesity  PRECAUTIONS: None  RED FLAGS: None  WEIGHT BEARING RESTRICTIONS: No  FALLS:  Has patient fallen in last 6 months? No  LIVING ENVIRONMENT: Lives with: lives alone Lives in: House/apartment Stairs: Yes: External: 13 steps; on left going up Has following equipment at home: None  OCCUPATION: carpentery work 8 hrs day, residential work  PLOF: Independent  PATIENT GOALS: not hurt   OBJECTIVE: (objective measures  completed at initial evaluation unless otherwise dated)  DIAGNOSTIC FINDINGS:  L foot/ankle MRI 11/24:  - Acute comminuted fx of the L navicular  - bony contusion/microfracture trabecular fx medial cunieform  - Bony contusion medial cuneiform  - Sprains of midfoot ligaments w/ partial tear of talonavicular joint capsule  - talonavicular and subtalar jt effusions  - tendonisis and low grade partial of distal Achilles  - mild PTT tendinosis/tenosynovitis  - diffuse subcutaneous edema  PATIENT SURVEYS:  LEFS = 46/80 FAOS scores:  61, 56, 71, 50, 44 / 100  COGNITION: Overall cognitive status: Within functional limits for tasks assessed    SENSATION: WFL  EDEMA:  slightly  POSTURE:  Severe navicular drop (see test results below) with minimally maintained arch L foot in comparison with R  PALPATION: TTP over the PTT tendon 1-2 inches proximal to the medial malleolus and from m. Malleolus to navicular; some peroneal tendon tenderness in the foot.   P. Fascia is nontender, heel is nontender, Achilles nontender  MUSCLE LENGTH: Hamstrings: Right SLR 75 deg; Left SLR 80 deg Thomas test:  not tight R or L Hamstrings: mild to moderate tightness ITB: NT  Piriformis: slight tight L > R Hip flexors: not tight Quads: NT Heelcord: mod tight L >R  LOWER EXTREMITY ROM:  Active ROM Right eval Left eval  Hip flexion    Hip extension    Hip abduction    Hip adduction    Hip internal rotation    Hip external rotation    Knee flexion    Knee extension    Ankle dorsiflexion 7 6  Ankle plantarflexion    Ankle inversion 31 12  Ankle eversion 22 20   LOWER EXTREMITY MMT:  Passive ROM Right eval Left eval  Hip flexion    Hip extension    Hip abduction    Hip adduction    Hip internal rotation    Hip external rotation    Knee flexion  Knee extension    Ankle dorsiflexion 5 4+  Ankle plantarflexion 5 3; p!  Ankle inversion 5 4; p!  Ankle eversion 5 a  (Blank rows = not  tested)       4; p!    LOWER EXTREMITY SPECIAL TESTS:  Ankle special tests: Anterior drawer test: negative, Talar tilt test: negative, and Great toe extension test: negative Navicular drop:  sitting NWB = 4cm LLE, 5cm RLE;  standing = 2 cm LLE; 4 cm RLE Supine leg lengths are equal Calcaneal positioning at subtalar neutral is 0 deg for both R and LLE Forefoot to rearfoot positioning:  LLE = 2-4 deg varus; RLE =  0 deg  FUNCTIONAL TESTS:  TBD  GAIT: Distance walked: 250' into clinic Assistive device utilized: None Level of assistance: Complete Independence Gait pattern: see below Comments: toes out BLE L >R with poor arch maintenance on LLE;  supinates on the RLE   TODAY'S TREATMENT:   SELF CARE: Provided education on PT POC progression and for pain management options;  initial HEP KT tape for posterior tibialis tendon x 2 I strips from base of 5th to m malleolus and proximally around leg; 3rd short I strip from navicular posteriorly around m. Malleolus and proximally about 2 inches   PATIENT EDUCATION:  Education details: PT eval findings, anticipated POC, and initial HEP  Person educated: Patient Education method: Explanation, Demonstration, Verbal cues, Tactile cues, and Handouts Education comprehension: verbalized understanding, verbal cues required, tactile cues required, and needs further education  HOME EXERCISE PROGRAM: Access Code: [email or web address] URL: [email or web address] Date: 11/08/2023 Prepared by: Garnette Montclair  Exercises - Ankle Inversion with Resistance  - 1 x daily - 7 x weekly - 3 sets - 10 reps - Sidelying Ankle Inversion with Ankle Weight  - 1 x daily - 7 x weekly - 3 sets - 10 reps - Ankle Eversion with Resistance  - 1 x daily - 7 x weekly - 3 sets - 10 reps - Long Sitting Calf Stretch with Strap  - 1 x daily - 7 x weekly - 3 sets - 10 reps - Standing Soleus Stretch on Step  - 1 x daily - 7 x weekly - 1 sets - 2 reps - 1 min hold - Standing  Gastroc Stretch on Step  - 1 x daily - 7 x weekly - 1 sets - 2 reps - 1 min hold   ASSESSMENT:  CLINICAL IMPRESSION: Dakota Wolf is a 33 y.o. male who was referred to physical therapy for evaluation and treatment for L posterior tibial tendon insufficiency.    Patient reports onset of L foot/ankle pain beginning over last 3-4 months.   HE came here for therapy 10 months ago following a L navicular fx and did very well.   He was not having pain of problems when we d/c therapy.   However, over the last few months he reports his L foot arch has just started collapsing for no apparent reason.   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 Pain is worse as the day goes on.   He wears workboots all day.   He does not have any orthotics, but was advised by MD to get SOLE brand inserts.   He is encouraged to go ahead and get them.  Patient has deficits in L ankle ROM, L LE flexibility, L ankle strength, arch collapse with TTP over PTT and peroneal tendons which  are interfering with ADLs and are impacting quality of life.  On LEFS patient scored 46/80 demonstrating 42.5% functional limitation.  Dakota will benefit from skilled PT to address above deficits to improve mobility and activity tolerance with decreased pain interference.  OBJECTIVE IMPAIRMENTS: Abnormal gait, difficulty walking, decreased ROM, decreased strength, impaired flexibility, and pain.   ACTIVITY LIMITATIONS: locomotion level  PARTICIPATION LIMITATIONS: occupation and any prolonged WB activities  PERSONAL FACTORS: Profession, Time since onset of injury/illness/exacerbation, and 1-2 comorbidities:  HTN, L navicular fx, endovenous ablation of L saphenous vein, obesity are also affecting patient's functional outcome.   REHAB POTENTIAL: Good  CLINICAL DECISION MAKING: Evolving/moderate complexity  EVALUATION COMPLEXITY: Moderate   GOALS: Goals reviewed with patient? Yes  SHORT TERM  GOALS: Target date: 12/19/2023  Patient will be independent with initial HEP. Baseline: 100% PT assist required for correct completion Goal status: INITIAL  2.  Patient will report at least 25% improvement in L ankle/foot pain to improve QOL. Baseline: 8/10 worst Goal status: INITIAL   LONG TERM GOALS: Target date: 01/30/2024   Patient will be independent with advanced/ongoing HEP to improve outcomes and carryover.  Baseline: no advanced HEP yet Goal status: INITIAL  2.  Patient will report at least 50-75% improvement in L ankle/foot pain to improve QOL. Baseline: 8/10 worst Goal status: INITIAL  3.  Patient will demonstrate improved L ankle AROM to 75 - 100% of the R ankle to allow for normal gait and stair mechanics. Baseline: Refer to above LE ROM table Goal status: INITIAL  4.  Patient will demonstrate improved L foot/ankle strength to >/= 5/5 for improved stability and ease of mobility. Baseline: Refer to above LE MMT table Goal status: INITIAL  5.  Patient will be able to ambulate 8 hours/day at his job with normal gait pattern without increased foot/ankle pain     Baseline: works 8 hr/day, but is in pain Goal status: INITIAL  7.  Patient will report >/= 60/80 on LEFS (MCID = 9 pts) to demonstrate improved functional ability. Baseline: 46/80 Goal status: INITIAL  PLAN:  PT FREQUENCY: 1-2x/week  PT DURATION: 12 weeks  PLANNED INTERVENTIONS: 97164- PT Re-evaluation, 97750- Physical Performance Testing, 97110-Therapeutic exercises, 97530- Therapeutic activity, [id number]- Neuromuscular re-education, 97535- Self Care, 02859- Manual therapy, [id number]- Gait training, [phone]- Orthotic Initial, [phone]- Electrical stimulation (unattended), 97016- Vasopneumatic device, [id number]- Ultrasound, [id number]- Ionotophoresis 4mg /ml Dexamethasone, 79439 (1-2 muscles), 20561 (3+ muscles)- Dry Needling, Patient/Family education, Balance training, Stair training, Taping, Joint mobilization, Cryotherapy, and  Moist heat  PLAN FOR NEXT SESSION: assess KT tape, try ionto if willing; progress L ankle ROM, stretching, arch scrunches in sitting/standing, review HEP, BAPS for PTT,    Dakota Wolf, PT 11/08/2023, 4:01 PM

## 2023-11-08 ENCOUNTER — Encounter: Payer: Self-pay | Admitting: Rehabilitation

## 2023-11-08 ENCOUNTER — Other Ambulatory Visit: Payer: Self-pay

## 2023-11-08 ENCOUNTER — Ambulatory Visit: Attending: Family Medicine | Admitting: Rehabilitation

## 2023-11-08 DIAGNOSIS — R262 Difficulty in walking, not elsewhere classified: Secondary | ICD-10-CM | POA: Diagnosis present

## 2023-11-08 DIAGNOSIS — M25572 Pain in left ankle and joints of left foot: Secondary | ICD-10-CM | POA: Diagnosis present

## 2023-11-08 DIAGNOSIS — R600 Localized edema: Secondary | ICD-10-CM | POA: Diagnosis present

## 2023-11-08 DIAGNOSIS — M25672 Stiffness of left ankle, not elsewhere classified: Secondary | ICD-10-CM | POA: Insufficient documentation

## 2023-11-08 DIAGNOSIS — M76822 Posterior tibial tendinitis, left leg: Secondary | ICD-10-CM | POA: Diagnosis not present

## 2023-11-08 DIAGNOSIS — M6281 Muscle weakness (generalized): Secondary | ICD-10-CM | POA: Diagnosis present

## 2023-11-15 ENCOUNTER — Ambulatory Visit: Admitting: Rehabilitation

## 2023-11-15 ENCOUNTER — Ambulatory Visit: Admitting: Family Medicine

## 2023-11-15 ENCOUNTER — Encounter: Payer: Self-pay | Admitting: Family Medicine

## 2023-11-15 VITALS — BP 129/89 | HR 86 | Ht 78.0 in | Wt 376.0 lb

## 2023-11-15 DIAGNOSIS — M6281 Muscle weakness (generalized): Secondary | ICD-10-CM

## 2023-11-15 DIAGNOSIS — R262 Difficulty in walking, not elsewhere classified: Secondary | ICD-10-CM

## 2023-11-15 DIAGNOSIS — Z0184 Encounter for antibody response examination: Secondary | ICD-10-CM

## 2023-11-15 DIAGNOSIS — Z7184 Encounter for health counseling related to travel: Secondary | ICD-10-CM | POA: Insufficient documentation

## 2023-11-15 DIAGNOSIS — M25572 Pain in left ankle and joints of left foot: Secondary | ICD-10-CM

## 2023-11-15 DIAGNOSIS — F40243 Fear of flying: Secondary | ICD-10-CM | POA: Diagnosis not present

## 2023-11-15 DIAGNOSIS — Z23 Encounter for immunization: Secondary | ICD-10-CM

## 2023-11-15 MED ORDER — ATOVAQUONE-PROGUANIL HCL 250-100 MG PO TABS: 1.0000 | ORAL_TABLET | Freq: Every day | ORAL | 0 refills | Status: AC

## 2023-11-15 MED ORDER — LORAZEPAM 1 MG PO TABS
1.0000 mg | ORAL_TABLET | Freq: Four times a day (QID) | ORAL | 0 refills | Status: AC | PRN
Start: 2023-11-15 — End: ?

## 2023-11-15 MED ORDER — AZITHROMYCIN 250 MG PO TABS
ORAL_TABLET | ORAL | 0 refills | Status: AC
Start: 2023-11-15 — End: ?

## 2023-11-15 MED ORDER — TYPHOID VACCINE PO CPDR
1.0000 | DELAYED_RELEASE_CAPSULE | ORAL | 0 refills | Status: AC
Start: 2023-11-15 — End: ?

## 2023-11-15 NOTE — Assessment & Plan Note (Signed)
 Reviewed guidelines and recommendations published by CDC for travel to [place].  Given hep A vaccine today.  Will check MMR status.  Prescription for oral typhoid vaccine sent in.  Will use Malarone  for malaria prevention.  Prescription for azithromycin  sent in for treatment of traveler's diarrhea if needed.  He will check with health department or travel medicine clinic for yellow fever vaccination.

## 2023-11-15 NOTE — Progress Notes (Signed)
 Dakota Wolf - 33 y.o. male MRN [id number]  Date of birth: 11/06/1990  Subjective Chief Complaint  Patient presents with   Immunizations    HPI Dakota Wolf is a 33 y.o. male here today for travel medicine advice.  He was traveling to [place] for a couple weeks in September.  He needs recommended immunizations and preventative medications prior to travel.  [place]  He has never had typhoid vaccination hep A vaccine.  ROS:  A comprehensive ROS was completed and negative except as noted per HPI  No Known Allergies  Past Medical History:  Diagnosis Date   Hypertension     Past Surgical History:  Procedure Laterality Date   ENDOVENOUS ABLATION SAPHENOUS VEIN W/ LASER Left 07/14/2022   endovenous laser ablation left greater saphenous vein and stab phlebectomy >20 incisions left leg by [name]    Social History   Socioeconomic History   Marital status: Single    Spouse name: Not on file   Number of children: 0   Years of education: Not on file   Highest education level: Bachelor's degree (e.g., BA, AB, BS)  Occupational History   Not on file  Tobacco Use   Smoking status: Light Smoker    Types: Cigars   Smokeless tobacco: Never   Tobacco comments:    07/02/20 occas cigar  Vaping Use   Vaping status: Some Days   Substances: THC  Substance and Sexual Activity   Alcohol use: Yes    Alcohol/week: 2.0 - 3.0 standard drinks of alcohol    Types: 2 - 3 Cans of beer per week   Drug use: No   Sexual activity: Yes    Partners: Female    Birth control/protection: Condom  Other Topics Concern   Not on file  Social History Narrative   Caffeine- 20 oz coffee, 2 sodas daily   Social Drivers of Corporate investment banker Strain: Low Risk  (09/28/2023)   Overall Financial Resource Strain (CARDIA)    Difficulty of Paying Living Expenses: Not hard at all  Food Insecurity: No Food Insecurity (09/28/2023)   Hunger Vital  Sign    Worried About Running Out of Food in the Last Year: Never true    Ran Out of Food in the Last Year: Never true  Transportation Needs: No Transportation Needs (09/28/2023)   PRAPARE - Administrator, Civil Service (Medical): No    Lack of Transportation (Non-Medical): No  Physical Activity: Insufficiently Active (09/28/2023)   Exercise Vital Sign    Days of Exercise per Week: 3 days    Minutes of Exercise per Session: 30 min  Stress: No Stress Concern Present (09/28/2023)   [employer] of Occupational Health - Occupational Stress Questionnaire    Feeling of Stress: Not at all  Social Connections: Moderately Integrated (09/28/2023)   Social Connection and Isolation Panel    Frequency of Communication with Friends and Family: Twice a week    Frequency of Social Gatherings with Friends and Family: Twice a week    Attends Religious Services: 1 to 4 times per year    Active Member of [employer] or Organizations: Yes    Attends Banker Meetings: 1 to 4 times per year    Marital Status: Never married    Family History  Problem Relation Age of Onset   Hypertension Father    Other Maternal Uncle    Hypertension Maternal Grandmother  Other Cousin     Health Maintenance  Topic Date Due   Hepatitis C Screening  Never done   Hepatitis B Vaccines 19-59 Average Risk (1 of 3 - 19+ 3-dose series) Never done   HPV VACCINES (1 - 3-dose SCDM series) Never done   COVID-19 Vaccine (1 - 2024-25 season) Never done   INFLUENZA VACCINE  10/27/2023   Pneumococcal Vaccine (1 of 2 - PCV) 09/27/2024 (Originally 12/06/2008)   DTaP/Tdap/Td (2 - Td or Tdap) 11/27/2028   HIV Screening  Completed   Meningococcal B Vaccine  Aged Out     ----------------------------------------------------------------------------------------------------------------------------------------------------------------------------------------------------------------- Physical Exam BP 129/89   Pulse 86    Ht 6' 6 (1.981 m)   Wt (!) 376 lb (170.6 kg)   SpO2 97%   BMI 43.45 kg/m   Physical Exam Constitutional:      Appearance: Normal appearance.  Cardiovascular:     Rate and Rhythm: Normal rate and regular rhythm.  Pulmonary:     Effort: Pulmonary effort is normal.     Breath sounds: Normal breath sounds.  Neurological:     General: No focal deficit present.     Mental Status: He is alert.  Psychiatric:        Mood and Affect: Mood normal.        Behavior: Behavior normal.     ------------------------------------------------------------------------------------------------------------------------------------------------------------------------------------------------------------------- Assessment and Plan  Travel advice encounter Reviewed guidelines and recommendations published by CDC for travel to [place].  Given hep A vaccine today.  Will check MMR status.  Prescription for oral typhoid vaccine sent in.  Will use Malarone  for malaria prevention.  Prescription for azithromycin  sent in for treatment of traveler's diarrhea if needed.  He will check with health department or travel medicine clinic for yellow fever vaccination.  Anxiety with flying Prescription sent in for Ativan  to use as needed   Meds ordered this encounter  Medications   typhoid (VIVOTIF) DR capsule    Sig: Take 1 capsule by mouth every other day.    Dispense:  4 capsule    Refill:  0    Take >1 week before travel   atovaquone -proguanil (MALARONE ) 250-100 MG TABS tablet    Sig: Take 1 tablet by mouth daily. One tab PO daily starting 2 days before travel and finish 7 days after travel.    Dispense:  40 tablet    Refill:  0   LORazepam  (ATIVAN ) 1 MG tablet    Sig: Take 1 tablet (1 mg total) by mouth every 6 (six) hours as needed for anxiety (flying).    Dispense:  10 tablet    Refill:  0   azithromycin  (ZITHROMAX ) 250 MG tablet    Sig: 2 tabs p.o. on day 1, 1 tab 1 days 2 through 5.  For traveler's  diarrhea.    Dispense:  6 tablet    Refill:  0    No follow-ups on file.   Clinic today Nimeka of the neck Amy Adina is going to bed

## 2023-11-15 NOTE — Therapy (Signed)
 OUTPATIENT PHYSICAL THERAPY LOWER EXTREMITY EVALUATION   Patient Name: Dakota Wolf MRN: [id number] DOB:07-01-1990, 33 y.o., male Today's Date: 11/15/2023   END OF SESSION:  PT End of Session - 11/15/23 0805     Visit Number 2    Date for PT Re-Evaluation 01/03/24    Authorization Type [employer]    PT Start Time 0800    PT Stop Time 0850    PT Time Calculation (min) 50 min    Activity Tolerance Patient tolerated treatment well    Behavior During Therapy [facility] for tasks assessed/performed          Past Medical History:  Diagnosis Date   Hypertension    Past Surgical History:  Procedure Laterality Date   ENDOVENOUS ABLATION SAPHENOUS VEIN W/ LASER Left 07/14/2022   endovenous laser ablation left greater saphenous vein and stab phlebectomy >20 incisions left leg by Dakota Wolf   Patient Active Problem List   Diagnosis Date Noted   Eczema 09/28/2023   Morbid obesity (HCC) 03/02/2023   Fracture of navicular bone of left foot 01/11/2023   Obstructive sleep apnea 11/06/2022   Hypogonadism in male 07/10/2022   Varicose veins of left lower extremity with pain 09/06/2021   Lumbar radiculitis 05/27/2021   Essential hypertension 05/26/2020   Daytime somnolence 05/26/2020   Possible exposure to STD 05/26/2020    PCP: Dakota Bring, DO   REFERRING PROVIDER: [name], FNP   REFERRING DIAG: [phone] (ICD-10-CM) - Insufficiency of left posterior tibial tendon  THERAPY DIAG:  Pain in left ankle and joints of left foot  Muscle weakness (generalized)  Difficulty in walking, not elsewhere classified  RATIONALE FOR EVALUATION AND TREATMENT: Rehabilitation  ONSET DATE: 11/24   NEXT Wolf VISIT:    SUBJECTIVE:                                                                                                                                                                                                         SUBJECTIVE STATEMENT:  States tape didn't do  much.  Got his OTC Sole orthotics and is wearing them in his work boots daily.  States they are helping some.  Rates pain today 4-5/10.   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   States he has days with minimal pain and days where it is more severe.   No apparent reason for the changes in his pain level since his activity level is essentially unchanged.    EVAL:  Patient is a 33 y/o referred  to PT for L posterior tibialis insufficiency from [facility].   He has a long h/o chronic L foot and ankle pain and injuries.  Came to PT here from 11/24 - 1/25 following a L navicular fx.   MRI 11/24 showed multiple problems (see report below).  HE did well with PT earlier this year and regained good strength and pain relief except for residual weakness of the L posterior tibialis.  However, over last few months he has noticed his L arch collapsing progressively worse and having pain around the medial malloelus to the navicular area.  He f/u with podiatry and was advised to get SOLE inserts and come for PT.   His last office note from Dr Harden indicates that there is no new navicular fx but he does have some navicular spurring.  States pain is variable and some days doesn't hurt as much as others.  He can't relate the changes in pain to any change in his activity level since he works the same job daily.   However, pain is always worse as the day progresses.   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  He used to play arena football in past.  PAIN: Are you having pain? Yes: NPRS scale: 1/10 now; 8/10 worst Pain location: L ankle Pain description: achy, other times sharp Aggravating factors: as the day progresses  Relieving factors: sitting down, NWB positions  PERTINENT HISTORY:  HTN, L navicular fx, endovenous ablation of L saphenous vein, obesity  PRECAUTIONS: None  RED FLAGS: None  WEIGHT BEARING RESTRICTIONS: No  FALLS:  Has patient fallen in  last 6 months? No  LIVING ENVIRONMENT: Lives with: lives alone Lives in: House/apartment Stairs: Yes: External: 13 steps; on left going up Has following equipment at home: None  OCCUPATION: carpentery work 8 hrs day, residential work  PLOF: Independent  PATIENT GOALS: not hurt   OBJECTIVE: (objective measures completed at initial evaluation unless otherwise dated)  DIAGNOSTIC FINDINGS:  L foot/ankle MRI 11/24:  - Acute comminuted fx of the L navicular  - bony contusion/microfracture trabecular fx medial cunieform  - Bony contusion medial cuneiform  - Sprains of midfoot ligaments w/ partial tear of talonavicular joint capsule  - talonavicular and subtalar jt effusions  - tendonisis and low grade partial of distal Achilles  - mild PTT tendinosis/tenosynovitis  - diffuse subcutaneous edema  PATIENT SURVEYS:  LEFS = 46/80 FAOS scores:  61, 56, 71, 50, 44 / 100  COGNITION: Overall cognitive status: Within functional limits for tasks assessed    SENSATION: WFL  EDEMA:  slightly  POSTURE:  Severe navicular drop (see test results below) with minimally maintained arch L foot in comparison with R  PALPATION: TTP over the PTT tendon 1-2 inches proximal to the medial malleolus and from m. Malleolus to navicular; some peroneal tendon tenderness in the foot.   P. Fascia is nontender, heel is nontender, Achilles nontender  MUSCLE LENGTH: Hamstrings: Right SLR 75 deg; Left SLR 80 deg Thomas test:  not tight R or L Hamstrings: mild to moderate tightness ITB: NT  Piriformis: slight tight L > R Hip flexors: not tight Quads: NT Heelcord: mod tight L >R  LOWER EXTREMITY ROM:  Active ROM Right eval Left eval  Hip flexion    Hip extension    Hip abduction    Hip adduction    Hip internal rotation    Hip external rotation    Knee flexion  Knee extension    Ankle dorsiflexion 7 6  Ankle plantarflexion    Ankle inversion 31 12  Ankle eversion 22 20   LOWER EXTREMITY  MMT:  Passive ROM Right eval Left eval  Hip flexion    Hip extension    Hip abduction    Hip adduction    Hip internal rotation    Hip external rotation    Knee flexion    Knee extension    Ankle dorsiflexion 5 4+  Ankle plantarflexion 5 3; p!  Ankle inversion 5 4; p!  Ankle eversion 5 a  (Blank rows = not tested)       4; p!    LOWER EXTREMITY SPECIAL TESTS:  Ankle special tests: Anterior drawer test: negative, Talar tilt test: negative, and Great toe extension test: negative Navicular drop:  sitting NWB = 4cm LLE, 5cm RLE;  standing = 2 cm LLE; 4 cm RLE Supine leg lengths are equal Calcaneal positioning at subtalar neutral is 0 deg for both R and LLE Forefoot to rearfoot positioning:  LLE = 2-4 deg varus; RLE =  0 deg  FUNCTIONAL TESTS:  TBD  GAIT: Distance walked: 250' into clinic Assistive device utilized: None Level of assistance: Complete Independence Gait pattern: see below Comments: toes out BLE L >R with poor arch maintenance on LLE;  supinates on the RLE   TODAY'S TREATMENT:  11/15/23 THERAPEUTIC EXERCISE: To improve strength.  Demonstration, verbal and tactile cues throughout for technique. Nustep L6 x 6'  NEUROMUSCULAR RE-EDUCATION: To improve balance and proprioception. SLS w/ towel roll arch support x 1' x 2 LLE Long foam SLS with L foot half on/half hanging off medially x 1' x 2 Upside down BOSU step ups x 2/20 LLE  THERAPEUTIC ACTIVITIES: To improve functional performance.  Demonstration, verbal and tactile cues throughout for technique. Standing arch/toe towel scrunches x 50 LLE BAPS w/ 10# weight at PM for CW x 30; 10# PA for CCW x 30 LLE Seated ankle inversion, eversion, DF GTB x 30 LLE Seated ankle PF blue TB x 30 LLE Standing foot prop calf stretch x 1' x 2 LLE  Ionto Patch (#1 of 6) to L PTT insertion at navicular/instep - Dexamethasone 1 mL, 80 mA-min, 4-6 hr wear time    SELF CARE: Provided education on PT POC progression and for pain  management options;  initial HEP KT tape for posterior tibialis tendon x 2 I strips from base of 5th to m malleolus and proximally around leg; 3rd short I strip from navicular posteriorly around m. Malleolus and proximally about 2 inches   PATIENT EDUCATION:  Education details: HEP review and HEP update  Person educated: Patient Education method: Explanation, Demonstration, Verbal cues, Tactile cues, and Handouts Education comprehension: verbalized understanding, verbal cues required, tactile cues required, and needs further education  HOME EXERCISE PROGRAM: Access Code: [email or web address] URL: [email or web address] Date: 11/15/2023 Prepared by: Garnette Montclair  Exercises - Ankle Inversion with Resistance  - 1 x daily - 7 x weekly - 3 sets - 10 reps - Sidelying Ankle Inversion with Ankle Weight  - 1 x daily - 7 x weekly - 3 sets - 10 reps - Ankle Eversion with Resistance  - 1 x daily - 7 x weekly - 3 sets - 10 reps - Long Sitting Calf Stretch with Strap  - 1 x daily - 7 x weekly - 3 sets - 10 reps - Standing Soleus Stretch on Step  - 1 x daily - 7 x  weekly - 1 sets - 2 reps - 1 min hold - Standing Gastroc Stretch on Step  - 1 x daily - 7 x weekly - 1 sets - 2 reps - 1 min hold - Runner's Step Up on Flat Side of BOSU  - 1 x daily - 7 x weekly - 2 sets - 20 reps - Single Leg Stance with 3-Way Kick on Foam  - 1 x daily - 7 x weekly - 3 sets - 10 reps - Towel Scrunches  - 1 x daily - 7 x weekly - 3 sets - 10 reps ASSESSMENT:  CLINICAL IMPRESSION: Patient is progressing well with his HEP.  Inversion strength is progressing, but still weak.  Further improvement is needed for arch maintenance.  OTC inserts are helping, but may eventually need custom depending on how his pain responds to PT treatment.  Taping not likely going to help due to body size.  Initiated iontophoresis treatment today.   Added single leg balance w/ arch maintenance challenges.   He has difficulty maintaining level  foot with SLS on ledge-like foam  EVAL:  Dakota Conant is a 33 y.o. male who was referred to physical therapy for evaluation and treatment for L posterior tibial tendon insufficiency.    Patient reports onset of L foot/ankle pain beginning over last 3-4 months.   HE came here for therapy 10 months ago following a L navicular fx and did very well.   He was not having pain of problems when we d/c therapy.   However, over the last few months he reports his L foot arch has just started collapsing for no apparent reason.   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 Pain is worse as the day goes on.   He wears workboots all day.   He does not have any orthotics, but was advised by Wolf to get SOLE brand inserts.   He is encouraged to go ahead and get them.  Patient has deficits in L ankle ROM, L LE flexibility, L ankle strength, arch collapse with TTP over PTT and peroneal tendons which are interfering with ADLs and are impacting quality of life.  On LEFS patient scored 46/80 demonstrating 42.5% functional limitation.  Dakota will benefit from skilled PT to address above deficits to improve mobility and activity tolerance with decreased pain interference.  OBJECTIVE IMPAIRMENTS: Abnormal gait, difficulty walking, decreased ROM, decreased strength, impaired flexibility, and pain.   ACTIVITY LIMITATIONS: locomotion level  PARTICIPATION LIMITATIONS: occupation and any prolonged WB activities  PERSONAL FACTORS: Profession, Time since onset of injury/illness/exacerbation, and 1-2 comorbidities:  HTN, L navicular fx, endovenous ablation of L saphenous vein, obesity are also affecting patient's functional outcome.   REHAB POTENTIAL: Good  CLINICAL DECISION MAKING: Evolving/moderate complexity  EVALUATION COMPLEXITY: Moderate   GOALS: Goals reviewed with patient? Yes  SHORT TERM GOALS: Target date: 12/19/2023  Patient will be independent with initial  HEP. Baseline: 100% PT assist required for correct completion Goal status: IN PROGRESS  2.  Patient will report at least 25% improvement in L ankle/foot pain to improve QOL. Baseline: 8/10 worst 8/20:  5-6/10 x last 4 days Goal status: IN PROGRESS   LONG TERM GOALS: Target date: 01/30/2024   Patient will be independent with advanced/ongoing HEP to improve outcomes and carryover.  Baseline: no advanced HEP yet 11/15/23:  Added to HEP Goal status: IN PROGRESS  2.  Patient will report at least  50-75% improvement in L ankle/foot pain to improve QOL. Baseline: 8/10 worst Goal status: INITIAL  3.  Patient will demonstrate improved L ankle AROM to 75 - 100% of the R ankle to allow for normal gait and stair mechanics. Baseline: Refer to above LE ROM table Goal status: INITIAL  4.  Patient will demonstrate improved L foot/ankle strength to >/= 5/5 for improved stability and ease of mobility. Baseline: Refer to above LE MMT table Goal status: INITIAL  5.  Patient will be able to ambulate 8 hours/day at his job with normal gait pattern without increased foot/ankle pain     Baseline: works 8 hr/day, but is in pain Goal status: INITIAL  7.  Patient will report >/= 60/80 on LEFS (MCID = 9 pts) to demonstrate improved functional ability. Baseline: 46/80 Goal status: INITIAL  PLAN:  PT FREQUENCY: 1-2x/week  PT DURATION: 12 weeks  PLANNED INTERVENTIONS: 97164- PT Re-evaluation, 97750- Physical Performance Testing, 97110-Therapeutic exercises, 97530- Therapeutic activity, [id number]- Neuromuscular re-education, 97535- Self Care, [id number]- Manual therapy, [id number]- Gait training, [phone]- Orthotic Initial, [phone]- Electrical stimulation (unattended), 97016- Vasopneumatic device, [id number]- Ultrasound, [id number]- Ionotophoresis 4mg /ml Dexamethasone, 79439 (1-2 muscles), 20561 (3+ muscles)- Dry Needling, Patient/Family education, Balance training, Stair training, Taping, Joint mobilization, Cryotherapy, and Moist  heat  PLAN FOR NEXT SESSION: assess how ionto did; Assess soreness from today's treatment and progress if able or review/continue at current level PRN.  Leonela Kivi, PT 11/15/2023, 11:13 AM

## 2023-11-15 NOTE — Assessment & Plan Note (Signed)
 Prescription sent in for Ativan  to use as needed

## 2023-11-16 LAB — MEASLES/MUMPS/RUBELLA IMMUNITY
MUMPS ABS, IGG: 80.9 [AU]/ml (ref >10.9)
RUBEOLA AB, IGG: >300 [AU]/ml (ref >16.4)
Rubella Antibodies, IGG: 2.44 {index} (ref >0.99)

## 2023-11-16 IMAGING — MR MR LUMBAR SPINE W/O CM
4 of 5 series · 27 of 48 positions shown · non-contrast
Comparison: X-ray lumbar 05/27/2021.

CLINICAL DATA: Lumbar radiculopathy, symptoms persist with > 6 wks
treatment. Technologist note reports patient sneezed very hard 4
weeks ago, and notes some pain and numbness. History of herniated
disc since 6166.

EXAM:
MRI LUMBAR SPINE WITHOUT CONTRAST
TECHNIQUE: Multiplanar, multisequence MR imaging of the lumbar spine was
performed. No intravenous contrast was administered.

[Series 2: T2 · sagittal · 4.0mm · 0.81mm/px · 6 of 15 slices shown (1 of 2)]
[im 1/15]
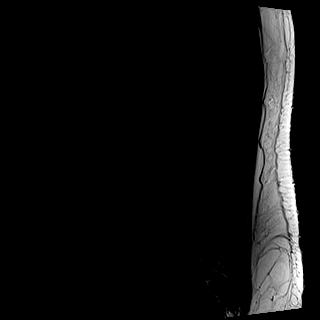
[im 3/15]
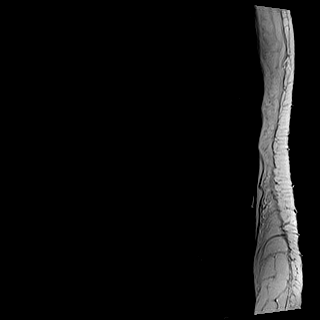
[im 6/15]
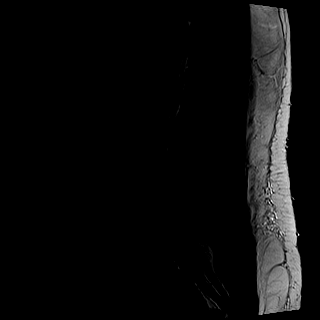
[im 9/15]
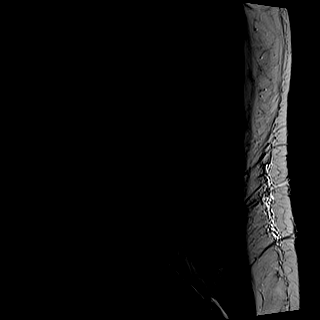
[im 12/15]
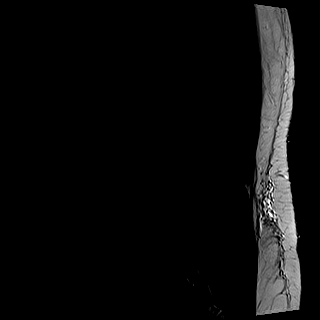
[im 15/15]
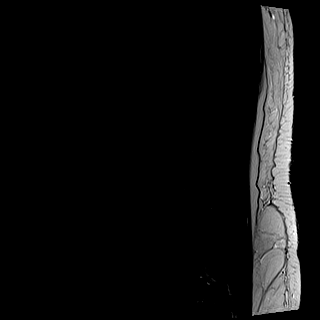

[Series 3: T1 · sagittal · 4.0mm · 0.41mm/px · 5 of 15 slices shown (1 of 2)]
[im 1/15]
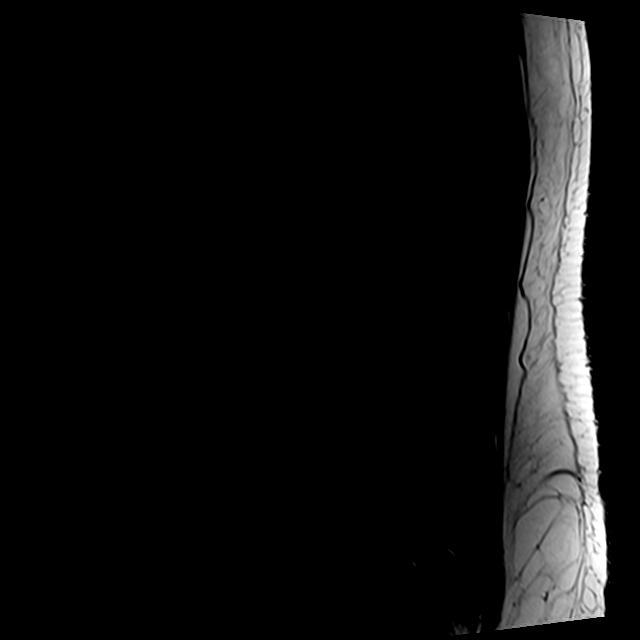
[im 4/15]
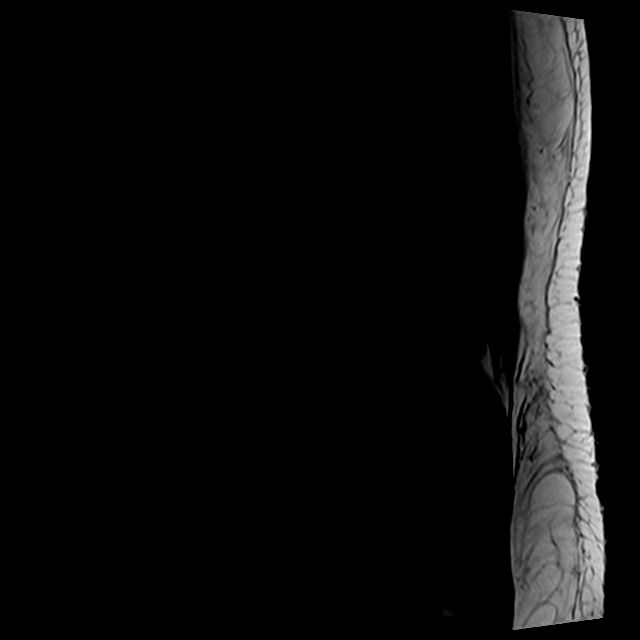
[im 8/15]
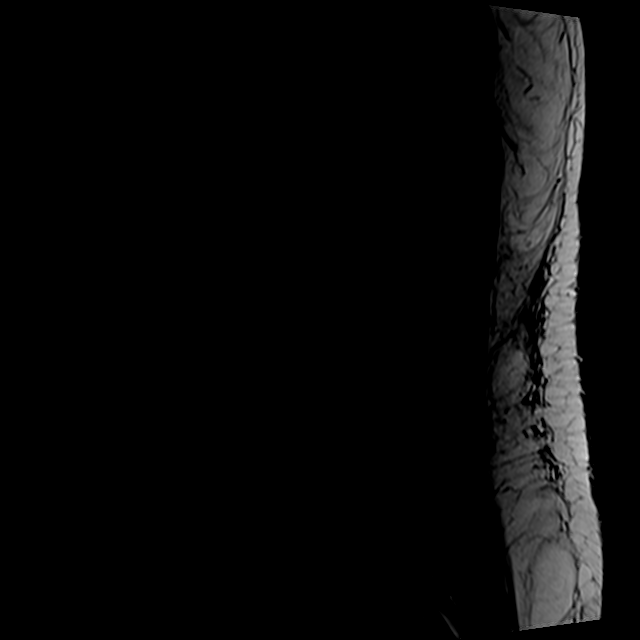
[im 11/15]
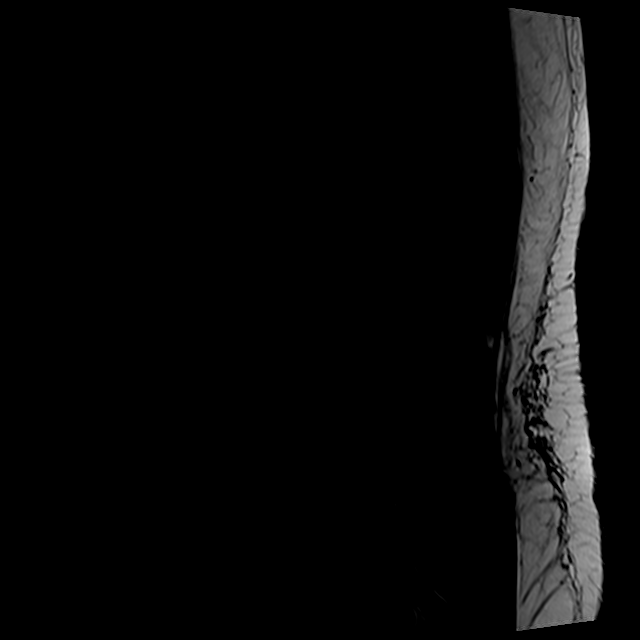
[im 15/15]
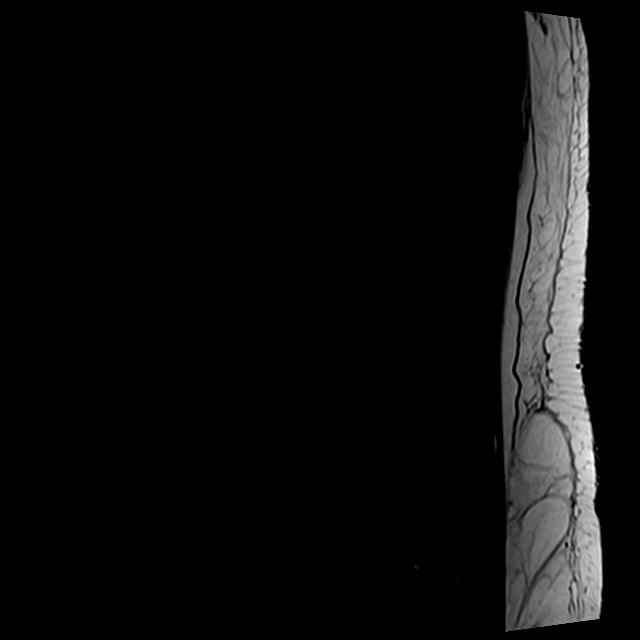

[Series 5: T2 · axial · 4.0mm · 0.78mm/px · z∈[-17,+219]mm · 10 of 47 slices shown (2 of 2)]
[im 4/47]
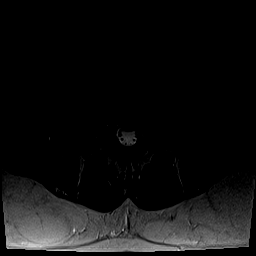
[im 7/47]
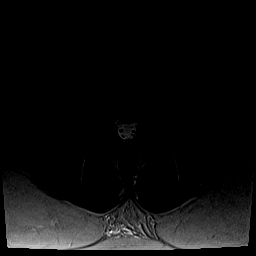
[im 10/47]
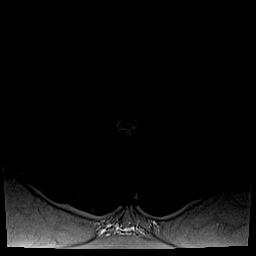
[im 16/47]
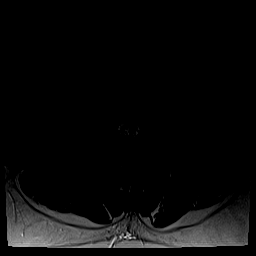
[im 22/47]
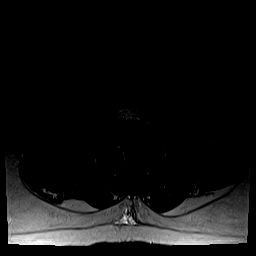
[im 25/47]
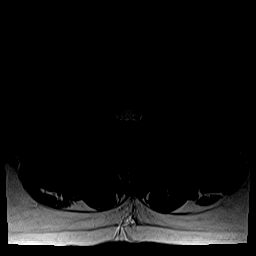
[im 28/47]
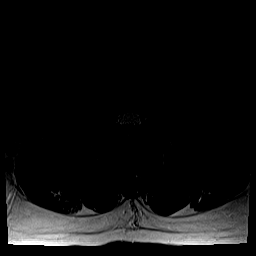
[im 34/47]
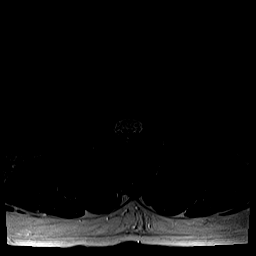
[im 40/47]
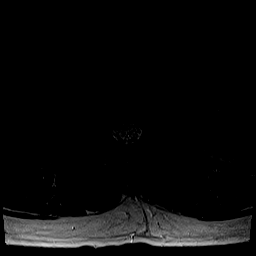
[im 47/47]
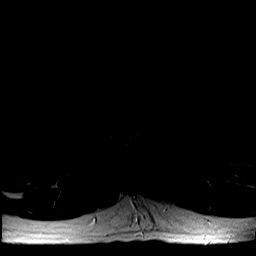

[Series 6: T1 · axial · 4.0mm · 0.39mm/px · z∈[-17,+184]mm · 6 of 47 slices shown (2 of 2)]
[im 4/47]
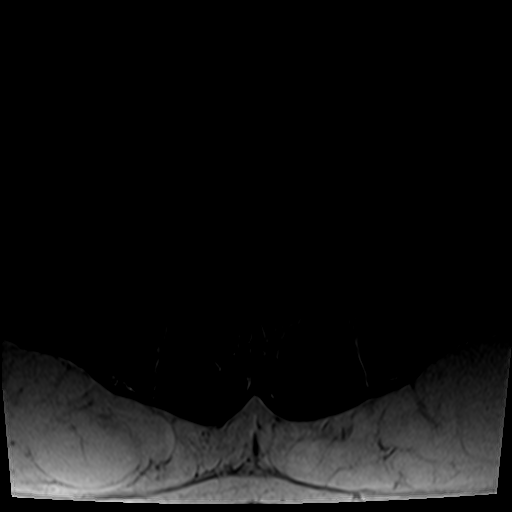
[im 7/47]
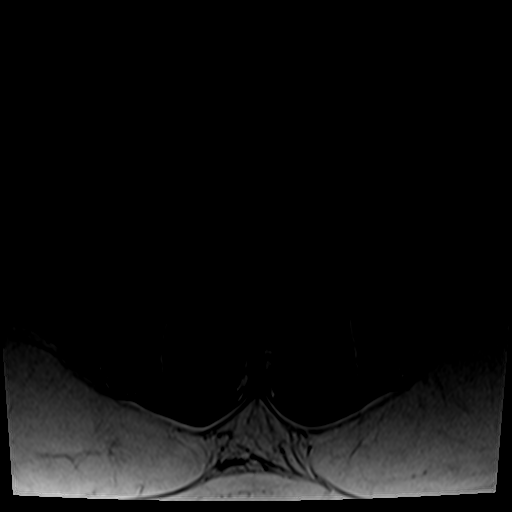
[im 10/47]
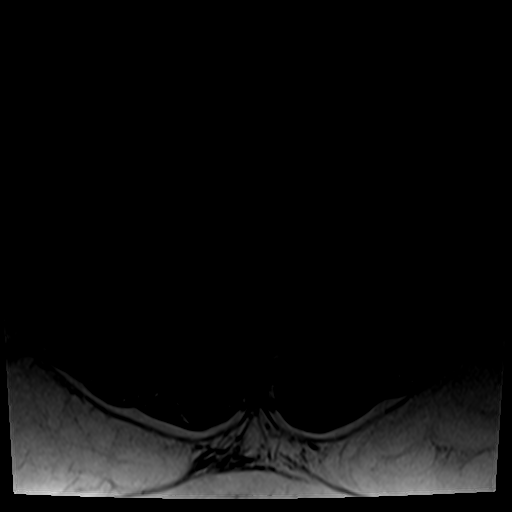
[im 16/47]
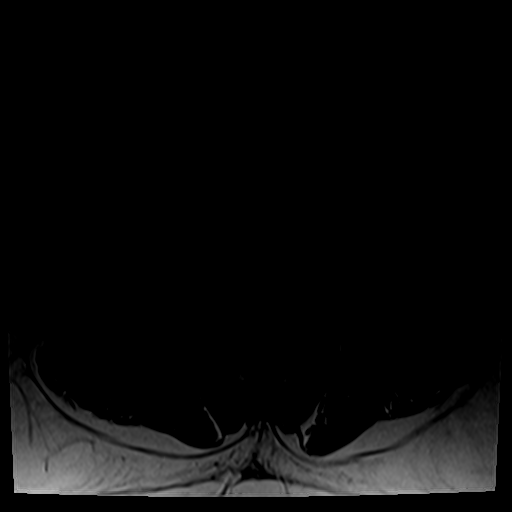
[im 25/47]
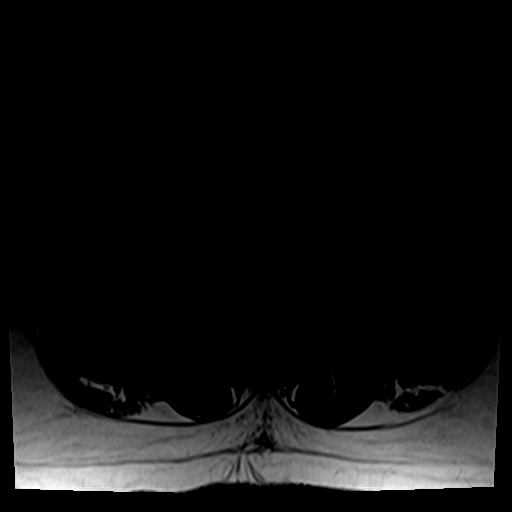
[im 40/47]
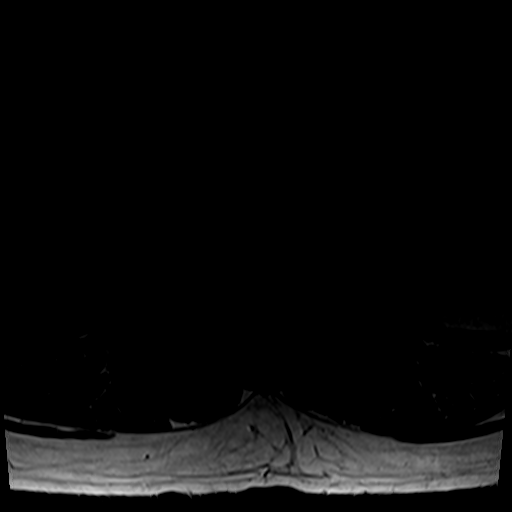

[27 of 48 positions shown; findings below may reference images not displayed]

FINDINGS: Segmentation:  Standard.

Alignment:  Physiologic.

Vertebrae: No fracture, evidence of discitis, or bone lesion.
Endplate degenerative changes at L4-5 and L5-S1.

Conus medullaris and cauda equina: Conus extends to the L1 level.
Conus and cauda equina appear normal.

Paraspinal and other soft tissues: Negative.

Disc levels:

T12-L1: Spinal neural stenosis.

L1-2: Spinal canal or neural foraminal stenosis.

L2-3: No spinal canal or neural foraminal stenosis.

L3-4: Mild facet degenerative changes. No spinal canal or neural
foraminal stenosis.

L4-5: Right asymmetric disc bulge with superimposed small central
disc protrusion and mild facet degenerative changes resulting in
mild spinal canal stenosis and mild bilateral neural foraminal
narrowing, right greater than left.

L5-S1: Disc bulge, mildly asymmetric to the left and mild facet
degenerative changes resulting in mild left neural foraminal
narrowing. No spinal canal stenosis.
IMPRESSION: 1. Mild spinal canal stenosis and mild bilateral neural foraminal
narrowing, right greater than left at L4-5.
2. Mild left neural foraminal narrowing at L5-S1.

## 2023-11-20 ENCOUNTER — Ambulatory Visit: Payer: Self-pay | Admitting: Family Medicine

## 2023-11-20 ENCOUNTER — Other Ambulatory Visit (HOSPITAL_COMMUNITY): Payer: Self-pay

## 2023-11-21 ENCOUNTER — Ambulatory Visit: Admitting: Rehabilitation

## 2023-11-21 ENCOUNTER — Encounter: Payer: Self-pay | Admitting: Rehabilitation

## 2023-11-21 DIAGNOSIS — M6281 Muscle weakness (generalized): Secondary | ICD-10-CM

## 2023-11-21 DIAGNOSIS — M25672 Stiffness of left ankle, not elsewhere classified: Secondary | ICD-10-CM

## 2023-11-21 DIAGNOSIS — R600 Localized edema: Secondary | ICD-10-CM

## 2023-11-21 DIAGNOSIS — M25572 Pain in left ankle and joints of left foot: Secondary | ICD-10-CM

## 2023-11-21 DIAGNOSIS — R262 Difficulty in walking, not elsewhere classified: Secondary | ICD-10-CM

## 2023-11-21 NOTE — Therapy (Signed)
 OUTPATIENT PHYSICAL THERAPY LOWER EXTREMITY EVALUATION   Patient Name: Dakota Wolf MRN: [id number] DOB:1990/12/09, 33 y.o., male Today's Date: 11/21/2023   END OF SESSION:  PT End of Session - 11/21/23 0802     Visit Number 3    Date for PT Re-Evaluation 01/03/24    Authorization Type [employer]    PT Start Time 0800    PT Stop Time 0848    PT Time Calculation (min) 48 min    Activity Tolerance Patient tolerated treatment well    Behavior During Therapy WFL for tasks assessed/performed          Past Medical History:  Diagnosis Date   Hypertension    Past Surgical History:  Procedure Laterality Date   ENDOVENOUS ABLATION SAPHENOUS VEIN W/ LASER Left 07/14/2022   endovenous laser ablation left greater saphenous vein and stab phlebectomy >20 incisions left leg by [name]   Patient Active Problem List   Diagnosis Date Noted   Travel advice encounter 11/15/2023   Anxiety with flying 11/15/2023   Eczema 09/28/2023   Morbid obesity (HCC) 03/02/2023   Fracture of navicular bone of left foot 01/11/2023   Obstructive sleep apnea 11/06/2022   Hypogonadism in male 07/10/2022   Varicose veins of left lower extremity with pain 09/06/2021   Lumbar radiculitis 05/27/2021   Essential hypertension 05/26/2020   Daytime somnolence 05/26/2020   Possible exposure to STD 05/26/2020    PCP: [name], DO   REFERRING PROVIDER: [name], FNP   REFERRING DIAG: [phone] (ICD-10-CM) - Insufficiency of left posterior tibial tendon  THERAPY DIAG:  Pain in left ankle and joints of left foot  Muscle weakness (generalized)  Difficulty in walking, not elsewhere classified  Stiffness of left ankle, not elsewhere classified  Localized edema  RATIONALE FOR EVALUATION AND TREATMENT: Rehabilitation  ONSET DATE: 11/24   NEXT MD VISIT:    SUBJECTIVE:                                                                                                                                                                                                          SUBJECTIVE STATEMENT:  States ionto patch didn't really do anything and declines further ionto treatments.  He reports pain 6/10 at this time.  EVAL:  Patient is a 33 y/o referred to PT for L posterior tibialis insufficiency from [facility].   He has a long h/o chronic L foot and ankle pain and injuries.  Came to PT here from 11/24 - 1/25 following a L navicular fx.  MRI 11/24 showed multiple problems (see report below).  HE did well with PT earlier this year and regained good strength and pain relief except for residual weakness of the L posterior tibialis.  However, over last few months he has noticed his L arch collapsing progressively worse and having pain around the medial malloelus to the navicular area.  He f/u with podiatry and was advised to get SOLE inserts and come for PT.   His last office note from Dr Harden indicates that there is no new navicular fx but he does have some navicular spurring.  States pain is variable and some days doesn't hurt as much as others.  He can't relate the changes in pain to any change in his activity level since he works the same job daily.   However, pain is always worse as the day progresses.   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  He used to play arena football in past.  PAIN: Are you having pain? Yes: NPRS scale: 1/10 now; 8/10 worst Pain location: L ankle Pain description: achy, other times sharp Aggravating factors: as the day progresses  Relieving factors: sitting down, NWB positions  PERTINENT HISTORY:  HTN, L navicular fx, endovenous ablation of L saphenous vein, obesity  PRECAUTIONS: None  RED FLAGS: None  WEIGHT BEARING RESTRICTIONS: No  FALLS:  Has patient fallen in last 6 months? No  LIVING ENVIRONMENT: Lives with: lives alone Lives in: House/apartment Stairs: Yes: External: 13 steps; on left going  up Has following equipment at home: None  OCCUPATION: carpentery work 8 hrs day, residential work  PLOF: Independent  PATIENT GOALS: not hurt   OBJECTIVE: (objective measures completed at initial evaluation unless otherwise dated)  DIAGNOSTIC FINDINGS:  L foot/ankle MRI 11/24:  - Acute comminuted fx of the L navicular  - bony contusion/microfracture trabecular fx medial cunieform  - Bony contusion medial cuneiform  - Sprains of midfoot ligaments w/ partial tear of talonavicular joint capsule  - talonavicular and subtalar jt effusions  - tendonisis and low grade partial of distal Achilles  - mild PTT tendinosis/tenosynovitis  - diffuse subcutaneous edema  PATIENT SURVEYS:  LEFS = 46/80 FAOS scores:  61, 56, 71, 50, 44 / 100  COGNITION: Overall cognitive status: Within functional limits for tasks assessed    SENSATION: WFL  EDEMA:  slightly  POSTURE:  Severe navicular drop (see test results below) with minimally maintained arch L foot in comparison with R  PALPATION: TTP over the PTT tendon 1-2 inches proximal to the medial malleolus and from m. Malleolus to navicular; some peroneal tendon tenderness in the foot.   P. Fascia is nontender, heel is nontender, Achilles nontender  MUSCLE LENGTH: Hamstrings: Right SLR 75 deg; Left SLR 80 deg Thomas test:  not tight R or L Hamstrings: mild to moderate tightness ITB: NT  Piriformis: slight tight L > R Hip flexors: not tight Quads: NT Heelcord: mod tight L >R  LOWER EXTREMITY ROM:  Active ROM Right eval Left eval  Hip flexion    Hip extension    Hip abduction    Hip adduction    Hip internal rotation    Hip external rotation    Knee flexion    Knee extension    Ankle dorsiflexion 7 6  Ankle plantarflexion    Ankle inversion 31 12  Ankle eversion 22 20   LOWER EXTREMITY MMT:  Passive ROM Right eval Left eval  Hip flexion  Hip extension    Hip abduction    Hip adduction    Hip internal rotation    Hip  external rotation    Knee flexion    Knee extension    Ankle dorsiflexion 5 4+  Ankle plantarflexion 5 3; p!  Ankle inversion 5 4; p!  Ankle eversion 5 a  (Blank rows = not tested)       4; p!    LOWER EXTREMITY SPECIAL TESTS:  Ankle special tests: Anterior drawer test: negative, Talar tilt test: negative, and Great toe extension test: negative Navicular drop:  sitting NWB = 4cm LLE, 5cm RLE;  standing = 2 cm LLE; 4 cm RLE Supine leg lengths are equal Calcaneal positioning at subtalar neutral is 0 deg for both R and LLE Forefoot to rearfoot positioning:  LLE = 2-4 deg varus; RLE =  0 deg  FUNCTIONAL TESTS:  TBD  GAIT: Distance walked: 250' into clinic Assistive device utilized: None Level of assistance: Complete Independence Gait pattern: see below Comments: toes out BLE L >R with poor arch maintenance on LLE;  supinates on the RLE   TODAY'S TREATMENT:  11/21/23 THERAPEUTIC EXERCISE: To improve strength.  Demonstration, verbal and tactile cues throughout for technique. Bike x 7' L5  NEUROMUSCULAR RE-EDUCATION: To improve balance. Long foam standing w/ medial foot hanging off with arch maintenance/inversion SLS x 1' LLE SLS LLE w/ towel arch support with R leg swings sagitally x 30 and frontally x 30 (karate kid swings) for strengthening/blaance with arch  Mechanically supported SLS Upside down BOSU balance x 1' x 2 trials for balance/proprio BOSU step ups x 3/10 LLE to SLS position with 2-3 sec holds for proprioception  THERAPEUTIC ACTIVITIES: To improve functional performance.  Demonstration, verbal and tactile cues throughout for technique. BAPS board with 4lb weight to AM and PM posts x 30 CW and x30 CCW Ankle inversion, eversion, DF RTB x 30 LLE Ankle PF Blue TB x 30 LLE   MANUAL THERAPY: To promote reduced pain utilizing connective tissue massage and therapeutic massage. Stripping/gliding along posterior tibialis tendon for adhesion reduction from posterior  malleolus/distal calf around to navicular insertion with some massage for edema around the m. malleolus  11/15/23 THERAPEUTIC EXERCISE: To improve strength.  Demonstration, verbal and tactile cues throughout for technique. Nustep L6 x 6'  NEUROMUSCULAR RE-EDUCATION: To improve balance and proprioception. SLS w/ towel roll arch support x 1' x 2 LLE Long foam SLS with L foot half on/half hanging off medially x 1' x 2 Upside down BOSU step ups x 2/20 LLE  THERAPEUTIC ACTIVITIES: To improve functional performance.  Demonstration, verbal and tactile cues throughout for technique. Standing arch/toe towel scrunches x 50 LLE BAPS w/ 10# weight at PM for CW x 30; 10# PA for CCW x 30 LLE Seated ankle inversion, eversion, DF GTB x 30 LLE Seated ankle PF blue TB x 30 LLE Standing foot prop calf stretch x 1' x 2 LLE  Ionto Patch (#1 of 6) to L PTT insertion at navicular/instep - Dexamethasone 1 mL, 80 mA-min, 4-6 hr wear time    SELF CARE: Provided education on PT POC progression and for pain management options;  initial HEP KT tape for posterior tibialis tendon x 2 I strips from base of 5th to m malleolus and proximally around leg; 3rd short I strip from navicular posteriorly around m. Malleolus and proximally about 2 inches   PATIENT EDUCATION:  Education details: HEP review and HEP update  Person educated:  Patient Education method: Explanation, Demonstration, Verbal cues, Tactile cues, and Handouts Education comprehension: verbalized understanding, verbal cues required, tactile cues required, and needs further education  HOME EXERCISE PROGRAM: Access Code: [email or web address] URL: [email or web address] Date: 11/15/2023 Prepared by: Garnette Montclair  Exercises - Ankle Inversion with Resistance  - 1 x daily - 7 x weekly - 3 sets - 10 reps - Sidelying Ankle Inversion with Ankle Weight  - 1 x daily - 7 x weekly - 3 sets - 10 reps - Ankle Eversion with Resistance  - 1 x daily - 7 x weekly  - 3 sets - 10 reps - Long Sitting Calf Stretch with Strap  - 1 x daily - 7 x weekly - 3 sets - 10 reps - Standing Soleus Stretch on Step  - 1 x daily - 7 x weekly - 1 sets - 2 reps - 1 min hold - Standing Gastroc Stretch on Step  - 1 x daily - 7 x weekly - 1 sets - 2 reps - 1 min hold - Runner's Step Up on Flat Side of BOSU  - 1 x daily - 7 x weekly - 2 sets - 20 reps - Single Leg Stance with 3-Way Kick on Foam  - 1 x daily - 7 x weekly - 3 sets - 10 reps - Towel Scrunches  - 1 x daily - 7 x weekly - 3 sets - 10 reps ASSESSMENT:  CLINICAL IMPRESSION: Continuing to work on arch/PTT strengthening activities with TB and with closed chain balance activitiies for neuro re education.  Patient is progressing well with Tband strength.  Closed chain arch maintenance strength is still lacking but some better than initial visit.  Pain remains variable depending on the day and better/worse for no apparent reason and doesn't seem related to activity level.  PT remains necessary for pain, weakness, proprioceptive balance deficits.  Continue per POC  EVAL:  Dakota Wolf is a 33 y.o. male who was referred to physical therapy for evaluation and treatment for L posterior tibial tendon insufficiency.    Patient reports onset of L foot/ankle pain beginning over last 3-4 months.   HE came here for therapy 10 months ago following a L navicular fx and did very well.   He was not having pain of problems when we d/c therapy.   However, over the last few months he reports his L foot arch has just started collapsing for no apparent reason.   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 Pain is worse as the day goes on.   He wears workboots all day.   He does not have any orthotics, but was advised by MD to get SOLE brand inserts.   He is encouraged to go ahead and get them.  Patient has deficits in L ankle ROM, L LE flexibility, L ankle strength, arch collapse with TTP over PTT  and peroneal tendons which are interfering with ADLs and are impacting quality of life.  On LEFS patient scored 46/80 demonstrating 42.5% functional limitation.  Dakota will benefit from skilled PT to address above deficits to improve mobility and activity tolerance with decreased pain interference.  OBJECTIVE IMPAIRMENTS: Abnormal gait, difficulty walking, decreased ROM, decreased strength, impaired flexibility, and pain.   ACTIVITY LIMITATIONS: locomotion level  PARTICIPATION LIMITATIONS: occupation and any prolonged WB activities  PERSONAL FACTORS: Profession, Time since onset of injury/illness/exacerbation, and 1-2 comorbidities:  HTN, L navicular fx, endovenous ablation of  L saphenous vein, obesity are also affecting patient's functional outcome.   REHAB POTENTIAL: Good  CLINICAL DECISION MAKING: Evolving/moderate complexity  EVALUATION COMPLEXITY: Moderate   GOALS: Goals reviewed with patient? Yes  SHORT TERM GOALS: Target date: 12/19/2023  Patient will be independent with initial HEP. Baseline: 100% PT assist required for correct completion Goal status: IN PROGRESS  2.  Patient will report at least 25% improvement in L ankle/foot pain to improve QOL. Baseline: 8/10 worst 8/20:  5-6/10 x last 4 days Goal status: IN PROGRESS   LONG TERM GOALS: Target date: 01/30/2024   Patient will be independent with advanced/ongoing HEP to improve outcomes and carryover.  Baseline: no advanced HEP yet 11/15/23:  Added to HEP Goal status: IN PROGRESS  2.  Patient will report at least 50-75% improvement in L ankle/foot pain to improve QOL. Baseline: 8/10 worst 8/26:  6/10 Goal status: IN PROGRESS  3.  Patient will demonstrate improved L ankle AROM to 75 - 100% of the R ankle to allow for normal gait and stair mechanics. Baseline: Refer to above LE ROM table Goal status: INITIAL  4.  Patient will demonstrate improved L foot/ankle strength to >/= 5/5 for improved stability and ease of  mobility. Baseline: Refer to above LE MMT table Goal status: INITIAL  5.  Patient will be able to ambulate 8 hours/day at his job with normal gait pattern without increased foot/ankle pain     Baseline: works 8 hr/day, but is in pain Goal status: INITIAL  7.  Patient will report >/= 60/80 on LEFS (MCID = 9 pts) to demonstrate improved functional ability. Baseline: 46/80 Goal status: INITIAL  PLAN:  PT FREQUENCY: 1-2x/week  PT DURATION: 12 weeks  PLANNED INTERVENTIONS: 97164- PT Re-evaluation, 97750- Physical Performance Testing, 97110-Therapeutic exercises, 97530- Therapeutic activity, [id number]- Neuromuscular re-education, 97535- Self Care, 02859- Manual therapy, [id number]- Gait training, [phone]- Orthotic Initial, [id number]- Electrical stimulation (unattended), 97016- Vasopneumatic device, [id number]- Ultrasound, [id number]- Ionotophoresis 4mg /ml Dexamethasone, 79439 (1-2 muscles), 20561 (3+ muscles)- Dry Needling, Patient/Family education, Balance training, Stair training, Taping, Joint mobilization, Cryotherapy, and Moist heat  PLAN FOR NEXT SESSION:  Add step hang toe raises with eccentrics, more dynamic balance activities  Alicianna Litchford, PT 11/21/2023, 1:28 PM

## 2023-11-22 ENCOUNTER — Telehealth (INDEPENDENT_AMBULATORY_CARE_PROVIDER_SITE_OTHER): Admitting: Family Medicine

## 2023-11-22 ENCOUNTER — Encounter: Payer: Self-pay | Admitting: Family Medicine

## 2023-11-22 ENCOUNTER — Encounter: Admitting: Physical Therapy

## 2023-11-22 VITALS — Ht 78.0 in | Wt 376.0 lb

## 2023-11-22 DIAGNOSIS — G4733 Obstructive sleep apnea (adult) (pediatric): Secondary | ICD-10-CM

## 2023-11-22 DIAGNOSIS — Z7184 Encounter for health counseling related to travel: Secondary | ICD-10-CM | POA: Diagnosis not present

## 2023-11-22 MED ORDER — ZEPBOUND 5 MG/0.5ML ~~LOC~~ SOAJ: 5.0000 mg | SUBCUTANEOUS | 0 refills | Status: AC

## 2023-11-22 MED ORDER — ZEPBOUND 7.5 MG/0.5ML ~~LOC~~ SOAJ: 7.5000 mg | SUBCUTANEOUS | 0 refills | Status: AC

## 2023-11-22 MED ORDER — ZEPBOUND 2.5 MG/0.5ML ~~LOC~~ SOAJ
2.5000 mg | SUBCUTANEOUS | 0 refills | Status: AC
Start: 2023-11-22 — End: ?

## 2023-11-22 NOTE — Progress Notes (Signed)
 Dakota Bair - 33 y.o. male MRN [id number]  Date of birth: [birth year]   All issues noted in this document were discussed and addressed.  No physical exam was performed (except for noted visual exam findings with Video Visits).  I discussed the limitations of evaluation and management by telemedicine and the availability of in person appointments. The patient expressed understanding and agreed to proceed.  I connected withNAME@ on 11/22/23 at  2:10 PM EDT by a video enabled telemedicine application and verified that I am speaking with the correct person using two identifiers.  Present at visit: Velma Ku, DO Dakota Swickard   Patient Location: Home [address] [place] [address]   Provider location:   [facility]   Chief Complaint  Patient presents with   Follow-up    HPI  Dakota Wolf is a 33 y.o. male who presents via audio/video conferencing for a telehealth visit today.  He also review once again which medications are for what is in preparation for his travel.  He also needs updated prescriptions for tirzepatide  called in.   ROS:  A comprehensive ROS was completed and negative except as noted per HPI  Past Medical History:  Diagnosis Date   Hypertension     Past Surgical History:  Procedure Laterality Date   ENDOVENOUS ABLATION SAPHENOUS VEIN W/ LASER Left 07/14/2022   endovenous laser ablation left greater saphenous vein and stab phlebectomy >20 incisions left leg by [name]    Family History  Problem Relation Age of Onset   Hypertension Father    Other Maternal Uncle    Hypertension Maternal Grandmother    Other Cousin     Social History   Socioeconomic History   Marital status: Single    Spouse name: Not on file   Number of children: 0   Years of education: Not on file   Highest education level: Bachelor's degree (e.g., BA, AB, BS)  Occupational History   Not on file  Tobacco Use   Smoking status: Light Smoker    Types: Cigars    Smokeless tobacco: Never   Tobacco comments:    07/02/20 occas cigar  Vaping Use   Vaping status: Some Days   Substances: THC  Substance and Sexual Activity   Alcohol use: Yes    Alcohol/week: 2.0 - 3.0 standard drinks of alcohol    Types: 2 - 3 Cans of beer per week   Drug use: No   Sexual activity: Yes    Partners: Female    Birth control/protection: Condom  Other Topics Concern   Not on file  Social History Narrative   Caffeine- 20 oz coffee, 2 sodas daily   Social Drivers of Corporate investment banker Strain: Low Risk  (09/28/2023)   Overall Financial Resource Strain (CARDIA)    Difficulty of Paying Living Expenses: Not hard at all  Food Insecurity: No Food Insecurity (09/28/2023)   Hunger Vital Sign    Worried About Running Out of Food in the Last Year: Never true    Ran Out of Food in the Last Year: Never true  Transportation Needs: No Transportation Needs (09/28/2023)   PRAPARE - Administrator, Civil Service (Medical): No    Lack of Transportation (Non-Medical): No  Physical Activity: Insufficiently Active (09/28/2023)   Exercise Vital Sign    Days of Exercise per Week: 3 days    Minutes of Exercise per Session: 30 min  Stress: No Stress Concern Present (09/28/2023)  [employer] of Occupational Health - Occupational Stress Questionnaire    Feeling of Stress: Not at all  Social Connections: Moderately Integrated (09/28/2023)   Social Connection and Isolation Panel    Frequency of Communication with Friends and Family: Twice a week    Frequency of Social Gatherings with Friends and Family: Twice a week    Attends Religious Services: 1 to 4 times per year    Active Member of [employer] or Organizations: Yes    Attends Banker Meetings: 1 to 4 times per year    Marital Status: Never married  Intimate Partner Violence: Not At Risk (09/28/2023)   Humiliation, Afraid, Rape, and Kick questionnaire    Fear of Current or Ex-Partner: No    Emotionally Abused:  No    Physically Abused: No    Sexually Abused: No     Current Outpatient Medications:    AMBULATORY NON FORMULARY MEDICATION, Please provide AutoPap with setting 4-20cmH20 Mask per patient preference.  Please provide headgear for mask dispensed, heated tubing, reservoir/humidifier, filters and any other accesories needed for machine.  Please send compliance data after 60 days., Disp: 1 Device, Rfl: 0   atovaquone -proguanil (MALARONE ) 250-100 MG TABS tablet, Take 1 tablet by mouth daily. One tab PO daily starting 2 days before travel and finish 7 days after travel., Disp: 40 tablet, Rfl: 0   azithromycin  (ZITHROMAX ) 250 MG tablet, 2 tabs p.o. on day 1, 1 tab 1 days 2 through 5.  For traveler's diarrhea., Disp: 6 tablet, Rfl: 0   LORazepam  (ATIVAN ) 1 MG tablet, Take 1 tablet (1 mg total) by mouth every 6 (six) hours as needed for anxiety (flying)., Disp: 10 tablet, Rfl: 0   meloxicam  (MOBIC ) 15 MG tablet, Take 1 tablet (15 mg total) by mouth daily. (Patient not taking: Reported on 11/08/2023), Disp: 30 tablet, Rfl: 0   oseltamivir  (TAMIFLU ) 75 MG capsule, Take 1 capsule (75 mg total) by mouth every 12 (twelve) hours. (Patient not taking: Reported on 11/08/2023), Disp: 10 capsule, Rfl: 0   promethazine -dextromethorphan (PROMETHAZINE -DM) 6.25-15 MG/5ML syrup, Take 5 mLs by mouth 2 (two) times daily as needed for cough. (Patient not taking: Reported on 11/08/2023), Disp: 118 mL, Rfl: 0   tirzepatide  (ZEPBOUND ) 2.5 MG/0.5ML Pen, Inject 2.5 mg into the skin once a week., Disp: 2 mL, Rfl: 0   tirzepatide  (ZEPBOUND ) 5 MG/0.5ML Pen, Inject 5 mg into the skin once a week., Disp: 2 mL, Rfl: 0   tirzepatide  (ZEPBOUND ) 7.5 MG/0.5ML Pen, Inject 7.5 mg into the skin once a week., Disp: 2 mL, Rfl: 0   traMADol  (ULTRAM ) 50 MG tablet, Take 1 tablet (50 mg total) by mouth every 8 (eight) hours as needed for moderate pain (pain score 4-6). (Patient not taking: Reported on 11/08/2023), Disp: 21 tablet, Rfl: 0    triamcinolone  cream (KENALOG ) 0.1 %, Apply 1 Application topically 2 (two) times daily. (Patient not taking: Reported on 11/08/2023), Disp: 30 g, Rfl: 0   typhoid (VIVOTIF) DR capsule, Take 1 capsule by mouth every other day., Disp: 4 capsule, Rfl: 0   valsartan  (DIOVAN ) 160 MG tablet, Take 1 tablet (160 mg total) by mouth daily., Disp: 90 tablet, Rfl: 3  EXAM:  VITALS per patient if applicable: Ht 6' 6 (1.981 m)   Wt (!) 376 lb (170.6 kg)   BMI 43.45 kg/m   GENERAL: alert, oriented, appears well and in no acute distress  HEENT: atraumatic, conjunttiva clear, no obvious abnormalities on inspection of external nose and  ears  NECK: normal movements of the head and neck  LUNGS: on inspection no signs of respiratory distress, breathing rate appears normal, no obvious gross SOB, gasping or wheezing  CV: no obvious cyanosis  MS: moves all visible extremities without noticeable abnormality  PSYCH/NEURO: pleasant and cooperative, no obvious depression or anxiety, speech and thought processing grossly intact  ASSESSMENT AND PLAN:  Discussed the following assessment and plan:  Travel advice encounter He has picked up all medications prescribed for his upcoming travel.  Has upcoming visit for yellow fever vaccination.  Reviewed one of his medications and he expresses understanding of when to begin medications.  OSA (obstructive sleep apnea) His appetite improved for treatment.  Updated prescription sent in.     I discussed the assessment and treatment plan with the patient. The patient was provided an opportunity to ask questions and all were answered. The patient agreed with the plan and demonstrated an understanding of the instructions.   The patient was advised to call back or seek an in-person evaluation if the symptoms worsen or if the condition fails to improve as anticipated.    Velma Ku, DO

## 2023-11-22 NOTE — Assessment & Plan Note (Signed)
 His appetite improved for treatment.  Updated prescription sent in.

## 2023-11-22 NOTE — Assessment & Plan Note (Signed)
 He has picked up all medications prescribed for his upcoming travel.  Has upcoming visit for yellow fever vaccination.  Reviewed one of his medications and he expresses understanding of when to begin medications.

## 2023-11-23 ENCOUNTER — Encounter: Payer: Self-pay | Admitting: Rehabilitation

## 2023-11-23 ENCOUNTER — Ambulatory Visit: Admitting: Rehabilitation

## 2023-11-23 DIAGNOSIS — M25572 Pain in left ankle and joints of left foot: Secondary | ICD-10-CM

## 2023-11-23 DIAGNOSIS — R600 Localized edema: Secondary | ICD-10-CM

## 2023-11-23 DIAGNOSIS — R262 Difficulty in walking, not elsewhere classified: Secondary | ICD-10-CM

## 2023-11-23 DIAGNOSIS — M6281 Muscle weakness (generalized): Secondary | ICD-10-CM

## 2023-11-23 DIAGNOSIS — M25672 Stiffness of left ankle, not elsewhere classified: Secondary | ICD-10-CM

## 2023-11-23 NOTE — Therapy (Addendum)
 OUTPATIENT PHYSICAL THERAPY LOWER EXTREMITY VISIT / DC SUMMARY   Patient Name: Dakota Wolf MRN: [id number] DOB:06-Jul-1990, 33 y.o., male Today's Date: 11/23/2023   END OF SESSION:  PT End of Session - 11/23/23 0802     Visit Number 4    Date for PT Re-Evaluation 01/03/24    Authorization Type [employer]    PT Start Time 0800    PT Stop Time 0848    PT Time Calculation (min) 48 min    Activity Tolerance Patient tolerated treatment well    Behavior During Therapy WFL for tasks assessed/performed          Past Medical History:  Diagnosis Date   Hypertension    Past Surgical History:  Procedure Laterality Date   ENDOVENOUS ABLATION SAPHENOUS VEIN W/ LASER Left 07/14/2022   endovenous laser ablation left greater saphenous vein and stab phlebectomy >20 incisions left leg by [name]   Patient Active Problem List   Diagnosis Date Noted   Travel advice encounter 11/15/2023   Anxiety with flying 11/15/2023   Eczema 09/28/2023   Morbid obesity (HCC) 03/02/2023   Fracture of navicular bone of left foot 01/11/2023   OSA (obstructive sleep apnea) 11/06/2022   Hypogonadism in male 07/10/2022   Varicose veins of left lower extremity with pain 09/06/2021   Lumbar radiculitis 05/27/2021   Essential hypertension 05/26/2020   Daytime somnolence 05/26/2020   Possible exposure to STD 05/26/2020    PCP: [name], DO   REFERRING PROVIDER: [name], FNP   REFERRING DIAG: [phone] (ICD-10-CM) - Insufficiency of left posterior tibial tendon  THERAPY DIAG:  Pain in left ankle and joints of left foot  Muscle weakness (generalized)  Difficulty in walking, not elsewhere classified  Stiffness of left ankle, not elsewhere classified  Localized edema  RATIONALE FOR EVALUATION AND TREATMENT: Rehabilitation  ONSET DATE: 11/24   NEXT MD VISIT:    SUBJECTIVE:                                                                                                                                                                                                          SUBJECTIVE STATEMENT:  States feeling a little more pain today after doing a lot of stepping up onto a work bench to do ceiling/wall sanding at work yesterday.  Rates pain 5/10.    EVAL:  Patient is a 33 y/o referred to PT for L posterior tibialis insufficiency from [facility].   He has a long h/o chronic L foot and ankle pain and injuries.  Came to PT here from 11/24 - 1/25 following a L navicular fx.   MRI 11/24 showed multiple problems (see report below).  HE did well with PT earlier this year and regained good strength and pain relief except for residual weakness of the L posterior tibialis.  However, over last few months he has noticed his L arch collapsing progressively worse and having pain around the medial malloelus to the navicular area.  He f/u with podiatry and was advised to get SOLE inserts and come for PT.   His last office note from Dr Harden indicates that there is no new navicular fx but he does have some navicular spurring.  States pain is variable and some days doesn't hurt as much as others.  He can't relate the changes in pain to any change in his activity level since he works the same job daily.   However, pain is always worse as the day progresses.   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  He used to play arena football in past.  PAIN: Are you having pain? Yes: NPRS scale: 1/10 now; 8/10 worst Pain location: L ankle Pain description: achy, other times sharp Aggravating factors: as the day progresses  Relieving factors: sitting down, NWB positions  PERTINENT HISTORY:  HTN, L navicular fx, endovenous ablation of L saphenous vein, obesity  PRECAUTIONS: None  RED FLAGS: None  WEIGHT BEARING RESTRICTIONS: No  FALLS:  Has patient fallen in last 6 months? No  LIVING ENVIRONMENT: Lives with: lives alone Lives in:  House/apartment Stairs: Yes: External: 13 steps; on left going up Has following equipment at home: None  OCCUPATION: carpentery work 8 hrs day, residential work  PLOF: Independent  PATIENT GOALS: not hurt   OBJECTIVE: (objective measures completed at initial evaluation unless otherwise dated)  DIAGNOSTIC FINDINGS:  L foot/ankle MRI 11/24:  - Acute comminuted fx of the L navicular  - bony contusion/microfracture trabecular fx medial cunieform  - Bony contusion medial cuneiform  - Sprains of midfoot ligaments w/ partial tear of talonavicular joint capsule  - talonavicular and subtalar jt effusions  - tendonisis and low grade partial of distal Achilles  - mild PTT tendinosis/tenosynovitis  - diffuse subcutaneous edema  PATIENT SURVEYS:  LEFS = 46/80 FAOS scores:  61, 56, 71, 50, 44 / 100  COGNITION: Overall cognitive status: Within functional limits for tasks assessed    SENSATION: WFL  EDEMA:  slightly  POSTURE:  Severe navicular drop (see test results below) with minimally maintained arch L foot in comparison with R  PALPATION: TTP over the PTT tendon 1-2 inches proximal to the medial malleolus and from m. Malleolus to navicular; some peroneal tendon tenderness in the foot.   P. Fascia is nontender, heel is nontender, Achilles nontender  MUSCLE LENGTH: Hamstrings: Right SLR 75 deg; Left SLR 80 deg Thomas test:  not tight R or L Hamstrings: mild to moderate tightness ITB: NT  Piriformis: slight tight L > R Hip flexors: not tight Quads: NT Heelcord: mod tight L >R  LOWER EXTREMITY ROM:  Active ROM Right eval Left eval  Hip flexion    Hip extension    Hip abduction    Hip adduction    Hip internal rotation    Hip external rotation    Knee flexion    Knee extension    Ankle dorsiflexion 7 6  Ankle plantarflexion    Ankle inversion 31 12  Ankle eversion 22 20  LOWER EXTREMITY MMT:  Passive ROM Right eval Left eval  Hip flexion    Hip extension     Hip abduction    Hip adduction    Hip internal rotation    Hip external rotation    Knee flexion    Knee extension    Ankle dorsiflexion 5 4+  Ankle plantarflexion 5 3; p!  Ankle inversion 5 4; p!  Ankle eversion 5 a  (Blank rows = not tested)       4; p!    LOWER EXTREMITY SPECIAL TESTS:  Ankle special tests: Anterior drawer test: negative, Talar tilt test: negative, and Great toe extension test: negative Navicular drop:  sitting NWB = 4cm LLE, 5cm RLE;  standing = 2 cm LLE; 4 cm RLE Supine leg lengths are equal Calcaneal positioning at subtalar neutral is 0 deg for both R and LLE Forefoot to rearfoot positioning:  LLE = 2-4 deg varus; RLE =  0 deg  FUNCTIONAL TESTS:  TBD  GAIT: Distance walked: 250' into clinic Assistive device utilized: None Level of assistance: Complete Independence Gait pattern: see below Comments: toes out BLE L >R with poor arch maintenance on LLE;  supinates on the RLE   TODAY'S TREATMENT:  11/23/23 THERAPEUTIC EXERCISE: To improve strength.  Demonstration, verbal and tactile cues throughout for technique. Bike x 7' L8  THERAPEUTIC ACTIVITIES: To improve functional performance.  Demonstration, verbal and tactile cues throughout for technique. BAPS 2 plates AM, 2 plates AL x 30 CCW; x 30 CW LLE Ankle inversion w/ RTB attempted but too painful Ankle eversion and DF RTB x 30 LLE Ankle PF blue TB x 30 Strap assist DF stretch straight x 1'; w/ inversion twist x 1'; w/ eversion twist x 1'  MANUAL THERAPY: To promote reduced pain utilizing kinesiotaping. Attempted arch strap w/ leukotape but adhesive won't hold;   did low dye taping instead to the LLE for arch support/maintenance  11/21/23 THERAPEUTIC EXERCISE: To improve strength.  Demonstration, verbal and tactile cues throughout for technique. Bike x 7' L5  NEUROMUSCULAR RE-EDUCATION: To improve balance. Long foam standing w/ medial foot hanging off with arch maintenance/inversion SLS x 1'  LLE SLS LLE w/ towel arch support with R leg swings sagitally x 30 and frontally x 30 (karate kid swings) for strengthening/blaance with arch  Mechanically supported SLS Upside down BOSU balance x 1' x 2 trials for balance/proprio BOSU step ups x 3/10 LLE to SLS position with 2-3 sec holds for proprioception  THERAPEUTIC ACTIVITIES: To improve functional performance.  Demonstration, verbal and tactile cues throughout for technique. BAPS board with 4lb weight to AM and PM posts x 30 CW and x30 CCW Ankle inversion, eversion, DF RTB x 30 LLE Ankle PF Blue TB x 30 LLE   MANUAL THERAPY: To promote reduced pain utilizing connective tissue massage and therapeutic massage. Stripping/gliding along posterior tibialis tendon for adhesion reduction from posterior malleolus/distal calf around to navicular insertion with some massage for edema around the m. malleolus  11/15/23 THERAPEUTIC EXERCISE: To improve strength.  Demonstration, verbal and tactile cues throughout for technique. Nustep L6 x 6'  NEUROMUSCULAR RE-EDUCATION: To improve balance and proprioception. SLS w/ towel roll arch support x 1' x 2 LLE Long foam SLS with L foot half on/half hanging off medially x 1' x 2 Upside down BOSU step ups x 2/20 LLE  THERAPEUTIC ACTIVITIES: To improve functional performance.  Demonstration, verbal and tactile cues throughout for technique. Standing arch/toe towel scrunches x 50 LLE  BAPS w/ 10# weight at PM for CW x 30; 10# PA for CCW x 30 LLE Seated ankle inversion, eversion, DF GTB x 30 LLE Seated ankle PF blue TB x 30 LLE Standing foot prop calf stretch x 1' x 2 LLE  Ionto Patch (#1 of 6) to L PTT insertion at navicular/instep - Dexamethasone 1 mL, 80 mA-min, 4-6 hr wear time    SELF CARE: Provided education on PT POC progression and for pain management options;  initial HEP KT tape for posterior tibialis tendon x 2 I strips from base of 5th to m malleolus and proximally around leg; 3rd short I strip  from navicular posteriorly around m. Malleolus and proximally about 2 inches   PATIENT EDUCATION:  Education details: low dye taping vs. Arch strap with leukotape for arch maintenance  Person educated: Patient Education method: Explanation, Demonstration, Verbal cues, Tactile cues, and Handouts Education comprehension: verbalized understanding, verbal cues required, tactile cues required, and needs further education  HOME EXERCISE PROGRAM: Access Code: [email or web address] URL: [email or web address] Date: 11/15/2023 Prepared by: Garnette Montclair  Exercises - Ankle Inversion with Resistance  - 1 x daily - 7 x weekly - 3 sets - 10 reps - Sidelying Ankle Inversion with Ankle Weight  - 1 x daily - 7 x weekly - 3 sets - 10 reps - Ankle Eversion with Resistance  - 1 x daily - 7 x weekly - 3 sets - 10 reps - Long Sitting Calf Stretch with Strap  - 1 x daily - 7 x weekly - 3 sets - 10 reps - Standing Soleus Stretch on Step  - 1 x daily - 7 x weekly - 1 sets - 2 reps - 1 min hold - Standing Gastroc Stretch on Step  - 1 x daily - 7 x weekly - 1 sets - 2 reps - 1 min hold - Runner's Step Up on Flat Side of BOSU  - 1 x daily - 7 x weekly - 2 sets - 20 reps - Single Leg Stance with 3-Way Kick on Foam  - 1 x daily - 7 x weekly - 3 sets - 10 reps - Towel Scrunches  - 1 x daily - 7 x weekly - 3 sets - 10 reps ASSESSMENT:  CLINICAL IMPRESSION: Patient having a lot of posterior tibialis tendon pain today and unable to tolerate any resisted inversion with theraband.  He is advised to hold TB inversion at home and only do AROM inversion x 30 reps progressing to antigravity inversion in sidelying x 30 reps and then return to yellow TB inversion x 30 reps.   He is only to progress when he can do 30 reps painfree.   Tried some low dye taping today for arch support.  Will see how this affects his pain.  PT remains necessary for pain, weakness, arch collapse on LLE.  Continue per POC  EVAL:  Kaz Ramiro is  a 33 y.o. male who was referred to physical therapy for evaluation and treatment for L posterior tibial tendon insufficiency.    Patient reports onset of L foot/ankle pain beginning over last 3-4 months.   HE came here for therapy 10 months ago following a L navicular fx and did very well.   He was not having pain of problems when we d/c therapy.   However, over the last few months he reports his L foot arch has just started collapsing for no apparent reason.   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 Pain is worse as the day goes on.   He wears workboots all day.   He does not have any orthotics, but was advised by MD to get SOLE brand inserts.   He is encouraged to go ahead and get them.  Patient has deficits in L ankle ROM, L LE flexibility, L ankle strength, arch collapse with TTP over PTT and peroneal tendons which are interfering with ADLs and are impacting quality of life.  On LEFS patient scored 46/80 demonstrating 42.5% functional limitation.  Cristian will benefit from skilled PT to address above deficits to improve mobility and activity tolerance with decreased pain interference.  OBJECTIVE IMPAIRMENTS: Abnormal gait, difficulty walking, decreased ROM, decreased strength, impaired flexibility, and pain.   ACTIVITY LIMITATIONS: locomotion level  PARTICIPATION LIMITATIONS: occupation and any prolonged WB activities  PERSONAL FACTORS: Profession, Time since onset of injury/illness/exacerbation, and 1-2 comorbidities:  HTN, L navicular fx, endovenous ablation of L saphenous vein, obesity are also affecting patient's functional outcome.   REHAB POTENTIAL: Good  CLINICAL DECISION MAKING: Evolving/moderate complexity  EVALUATION COMPLEXITY: Moderate   GOALS: Goals reviewed with patient? Yes  SHORT TERM GOALS: Target date: 12/19/2023  Patient will be independent with initial HEP. Baseline: 100% PT assist required for correct completion Goal  status: IN PROGRESS  2.  Patient will report at least 25% improvement in L ankle/foot pain to improve QOL. Baseline: 8/10 worst 8/20:  5-6/10 x last 4 days Goal status: IN PROGRESS   LONG TERM GOALS: Target date: 01/30/2024   Patient will be independent with advanced/ongoing HEP to improve outcomes and carryover.  Baseline: no advanced HEP yet 11/15/23:  Added to HEP Goal status: IN PROGRESS  2.  Patient will report at least 50-75% improvement in L ankle/foot pain to improve QOL. Baseline: 8/10 worst 8/26:  6/10 Goal status: IN PROGRESS  3.  Patient will demonstrate improved L ankle AROM to 75 - 100% of the R ankle to allow for normal gait and stair mechanics. Baseline: Refer to above LE ROM table Goal status: INITIAL  4.  Patient will demonstrate improved L foot/ankle strength to >/= 5/5 for improved stability and ease of mobility. Baseline: Refer to above LE MMT table Goal status: INITIAL  5.  Patient will be able to ambulate 8 hours/day at his job with normal gait pattern without increased foot/ankle pain     Baseline: works 8 hr/day, but is in pain Goal status: INITIAL  7.  Patient will report >/= 60/80 on LEFS (MCID = 9 pts) to demonstrate improved functional ability. Baseline: 46/80 Goal status: INITIAL  PLAN:  PT FREQUENCY: 1-2x/week  PT DURATION: 12 weeks  PLANNED INTERVENTIONS: 97164- PT Re-evaluation, 97750- Physical Performance Testing, 97110-Therapeutic exercises, 97530- Therapeutic activity, [id number]- Neuromuscular re-education, 97535- Self Care, [id number]- Manual therapy, [id number]- Gait training, [phone]- Orthotic Initial, [phone]- Electrical stimulation (unattended), 97016- Vasopneumatic device, [id number]- Ultrasound, [id number]- Ionotophoresis 4mg /ml Dexamethasone, 79439 (1-2 muscles), [id number] (3+ muscles)- Dry Needling, Patient/Family education, Balance training, Stair training, Taping, Joint mobilization, Cryotherapy, and Moist heat  PLAN FOR NEXT SESSION:  Asses effects of low dye  taping and redo PRN; Add step hang toe raises with eccentrics, more dynamic balance activities  PHYSICAL THERAPY DISCHARGE SUMMARY  Visits from Start of Care: 4  Current functional level related to goals / functional outcomes: Per assessment note above   Remaining deficits: Patient attended 4 visits and has not returned to clinic.   Hopefully he is doing  well.   Will D/C PT   Education / Equipment: Advised to continue HEP   Patient agrees to discharge. Patient goals were partially met. Patient is being discharged due to not returning since the last visit.   Hassan Blackshire, PT 11/23/2023, 2:42 PM

## 2023-11-24 ENCOUNTER — Encounter: Admitting: Physical Therapy

## 2023-11-28 ENCOUNTER — Encounter: Payer: Self-pay | Admitting: Sports Medicine

## 2023-12-20 ENCOUNTER — Encounter: Payer: Self-pay | Admitting: Family Medicine

## 2024-01-29 ENCOUNTER — Encounter: Payer: Self-pay | Admitting: Radiology

## 2024-02-28 ENCOUNTER — Ambulatory Visit: Admitting: Family Medicine

## 2024-03-15 ENCOUNTER — Ambulatory Visit: Admitting: Family Medicine

## 2024-03-26 ENCOUNTER — Ambulatory Visit: Admitting: Family Medicine
